# Patient Record
Sex: Male | Born: 2000 | Race: White | Marital: Single
Health system: Southern US, Community
[De-identification: ages and names within clinical notes are randomized; demographics above are authoritative.]

## PROBLEM LIST (undated history)

## (undated) ENCOUNTER — Emergency Department (HOSPITAL_COMMUNITY): Discharge status: Absent without leave | Payer: No Typology Code available for payment source

## (undated) DIAGNOSIS — R1110 Vomiting, unspecified: Secondary | ICD-10-CM

## (undated) DIAGNOSIS — G43019 Migraine without aura, intractable, without status migrainosus: Secondary | ICD-10-CM

## (undated) DIAGNOSIS — S0990XA Unspecified injury of head, initial encounter: Secondary | ICD-10-CM

## (undated) DIAGNOSIS — J302 Other seasonal allergic rhinitis: Secondary | ICD-10-CM

## (undated) DIAGNOSIS — R197 Diarrhea, unspecified: Secondary | ICD-10-CM

## (undated) DIAGNOSIS — Z789 Other specified health status: Secondary | ICD-10-CM

## (undated) DIAGNOSIS — Z8781 Personal history of (healed) traumatic fracture: Secondary | ICD-10-CM

## (undated) DIAGNOSIS — J45909 Unspecified asthma, uncomplicated: Secondary | ICD-10-CM

## (undated) DIAGNOSIS — F419 Anxiety disorder, unspecified: Secondary | ICD-10-CM

## (undated) HISTORY — DX: Diarrhea, unspecified: R19.7

## (undated) HISTORY — PX: NO PAST SURGERIES: SHX2092

## (undated) HISTORY — DX: Vomiting, unspecified: R11.10

## (undated) HISTORY — DX: Migraine without aura, intractable, without status migrainosus: G43.019

---
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
## 2018-01-05 ENCOUNTER — Encounter (HOSPITAL_COMMUNITY): Payer: Self-pay | Admitting: *Deleted

## 2018-01-05 ENCOUNTER — Other Ambulatory Visit: Payer: Self-pay

## 2018-01-05 ENCOUNTER — Emergency Department (HOSPITAL_COMMUNITY)
Admission: EM | Admit: 2018-01-05 | Discharge: 2018-01-05 | Disposition: A | Discharge status: Home / Self Care | Payer: Medicaid Other | Attending: Emergency Medicine | Admitting: Emergency Medicine

## 2018-01-05 ENCOUNTER — Emergency Department (HOSPITAL_COMMUNITY): Payer: Medicaid Other

## 2018-01-05 DIAGNOSIS — F17210 Nicotine dependence, cigarettes, uncomplicated: Secondary | ICD-10-CM | POA: Diagnosis not present

## 2018-01-05 DIAGNOSIS — R000 Tachycardia, unspecified: Secondary | ICD-10-CM | POA: Insufficient documentation

## 2018-01-05 DIAGNOSIS — R51 Headache: Secondary | ICD-10-CM | POA: Insufficient documentation

## 2018-01-05 DIAGNOSIS — J09X9 Influenza due to identified novel influenza A virus with other manifestations: Secondary | ICD-10-CM | POA: Diagnosis not present

## 2018-01-05 DIAGNOSIS — Z113 Encounter for screening for infections with a predominantly sexual mode of transmission: Secondary | ICD-10-CM | POA: Diagnosis not present

## 2018-01-05 DIAGNOSIS — M791 Myalgia, unspecified site: Secondary | ICD-10-CM | POA: Insufficient documentation

## 2018-01-05 DIAGNOSIS — R05 Cough: Secondary | ICD-10-CM | POA: Diagnosis present

## 2018-01-05 DIAGNOSIS — R509 Fever, unspecified: Secondary | ICD-10-CM | POA: Diagnosis not present

## 2018-01-05 DIAGNOSIS — R0981 Nasal congestion: Secondary | ICD-10-CM | POA: Diagnosis not present

## 2018-01-05 DIAGNOSIS — R0602 Shortness of breath: Secondary | ICD-10-CM | POA: Diagnosis not present

## 2018-01-05 DIAGNOSIS — R300 Dysuria: Secondary | ICD-10-CM | POA: Insufficient documentation

## 2018-01-05 DIAGNOSIS — J101 Influenza due to other identified influenza virus with other respiratory manifestations: Secondary | ICD-10-CM

## 2018-01-05 DIAGNOSIS — R0789 Other chest pain: Secondary | ICD-10-CM | POA: Diagnosis not present

## 2018-01-05 DIAGNOSIS — Z79899 Other long term (current) drug therapy: Secondary | ICD-10-CM | POA: Insufficient documentation

## 2018-01-05 LAB — CBC WITH DIFFERENTIAL/PLATELET
Basophils Absolute: 0 10*3/uL (ref 0.0–0.1)
Basophils Relative: 0 %
Eosinophils Absolute: 0 10*3/uL (ref 0.0–0.7)
Eosinophils Relative: 0 %
HCT: 38.9 % — ABNORMAL LOW (ref 39.0–52.0)
Hemoglobin: 13.2 g/dL (ref 13.0–17.0)
Lymphocytes Relative: 7 %
Lymphs Abs: 0.5 10*3/uL — ABNORMAL LOW (ref 0.7–4.0)
MCH: 29.8 pg (ref 26.0–34.0)
MCHC: 33.9 g/dL (ref 30.0–36.0)
MCV: 87.8 fL (ref 78.0–100.0)
Monocytes Absolute: 0.3 10*3/uL (ref 0.1–1.0)
Monocytes Relative: 4 %
Neutro Abs: 6.7 10*3/uL (ref 1.7–7.7)
Neutrophils Relative %: 89 %
Platelets: 152 10*3/uL (ref 150–400)
RBC: 4.43 MIL/uL (ref 4.22–5.81)
RDW: 12.5 % (ref 11.5–15.5)
WBC: 7.5 10*3/uL (ref 4.0–10.5)

## 2018-01-05 LAB — URINALYSIS, ROUTINE W REFLEX MICROSCOPIC
Bacteria, UA: NONE SEEN
Bilirubin Urine: NEGATIVE
Glucose, UA: NEGATIVE mg/dL
Ketones, ur: 80 mg/dL — AB
Leukocytes, UA: NEGATIVE
Nitrite: NEGATIVE
Protein, ur: NEGATIVE mg/dL
Specific Gravity, Urine: 1.012 (ref 1.005–1.030)
Squamous Epithelial / LPF: NONE SEEN
pH: 6 (ref 5.0–8.0)

## 2018-01-05 LAB — COMPREHENSIVE METABOLIC PANEL
ALT: 22 U/L (ref 17–63)
AST: 20 U/L (ref 15–41)
Albumin: 3.3 g/dL — ABNORMAL LOW (ref 3.5–5.0)
Alkaline Phosphatase: 64 U/L (ref 38–126)
Anion gap: 11 (ref 5–15)
BUN: 12 mg/dL (ref 6–20)
CO2: 20 mmol/L — ABNORMAL LOW (ref 22–32)
Calcium: 8 mg/dL — ABNORMAL LOW (ref 8.9–10.3)
Chloride: 104 mmol/L (ref 101–111)
Creatinine, Ser: 0.96 mg/dL (ref 0.61–1.24)
GFR calc Af Amer: >60 mL/min (ref >60)
GFR calc non Af Amer: >60 mL/min (ref >60)
Glucose, Bld: 113 mg/dL — ABNORMAL HIGH (ref 65–99)
Potassium: 3 mmol/L — ABNORMAL LOW (ref 3.5–5.1)
Sodium: 135 mmol/L (ref 135–145)
Total Bilirubin: 0.8 mg/dL (ref 0.3–1.2)
Total Protein: 5.5 g/dL — ABNORMAL LOW (ref 6.5–8.1)

## 2018-01-05 LAB — INFLUENZA PANEL BY PCR (TYPE A & B)
Influenza A By PCR: POSITIVE — AB
Influenza B By PCR: NEGATIVE

## 2018-01-05 MED ORDER — SODIUM CHLORIDE 0.9 % IV BOLUS (SEPSIS)
1000.0000 mL | Freq: Once | INTRAVENOUS | Status: AC
  Administered 2018-01-05: 1000 mL via INTRAVENOUS

## 2018-01-05 MED ORDER — ACETAMINOPHEN 325 MG PO TABS
650.0000 mg | ORAL_TABLET | Freq: Once | ORAL | Status: AC | PRN
  Administered 2018-01-05: 650 mg via ORAL
  Filled 2018-01-05: qty 2

## 2018-01-05 MED ORDER — ACETAMINOPHEN 500 MG PO TABS: 1000.0000 mg | ORAL_TABLET | Freq: Four times a day (QID) | ORAL | 0 refills | Status: DC | PRN

## 2018-01-05 MED ORDER — ACETAMINOPHEN 325 MG PO TABS
650.0000 mg | ORAL_TABLET | Freq: Once | ORAL | Status: AC
  Administered 2018-01-05: 650 mg via ORAL
  Filled 2018-01-05: qty 2

## 2018-01-05 MED ORDER — IBUPROFEN 600 MG PO TABS: 600.0000 mg | ORAL_TABLET | Freq: Four times a day (QID) | ORAL | 0 refills | Status: DC | PRN

## 2018-01-05 MED ORDER — BENZONATATE 100 MG PO CAPS
100.0000 mg | ORAL_CAPSULE | Freq: Once | ORAL | Status: AC
  Administered 2018-01-05: 100 mg via ORAL
  Filled 2018-01-05: qty 1

## 2018-01-05 MED ORDER — KETOROLAC TROMETHAMINE 30 MG/ML IJ SOLN
30.0000 mg | Freq: Once | INTRAMUSCULAR | Status: AC
  Administered 2018-01-05: 30 mg via INTRAVENOUS
  Filled 2018-01-05: qty 1

## 2018-01-05 MED ORDER — BENZONATATE 100 MG PO CAPS: 100.0000 mg | ORAL_CAPSULE | Freq: Three times a day (TID) | ORAL | 0 refills | Status: DC

## 2018-01-05 MED ORDER — POTASSIUM CHLORIDE CRYS ER 20 MEQ PO TBCR
60.0000 meq | EXTENDED_RELEASE_TABLET | Freq: Once | ORAL | Status: AC
  Administered 2018-01-05: 60 meq via ORAL
  Filled 2018-01-05: qty 3

## 2018-01-05 MED ORDER — OSELTAMIVIR PHOSPHATE 75 MG PO CAPS: 75.0000 mg | ORAL_CAPSULE | Freq: Two times a day (BID) | ORAL | 0 refills | Status: DC

## 2018-01-05 NOTE — ED Triage Notes (Signed)
Pt arrived by [facility] for flu like symptoms x 2 days. No relief with otc meds last night. HR 130 on arrival and febrile. Has cough, headache, bodyaches.
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
## 2018-01-05 NOTE — ED Notes (Signed)
Pt repeatedly stating that he is dizzy and about to pass out at triage.
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
## 2018-01-05 NOTE — ED Notes (Signed)
Pt returned from radiology on stretcher with tech, tolerated well.  

## 2018-01-05 NOTE — Discharge Instructions (Signed)
Medications: Tamiflu, Tessalon, ibuprofen, Tylenol  Treatment: Take Tamiflu as prescribed for 5 days.  Take Tessalon every 8 hours as needed for cough.  Take ibuprofen as prescribed every 6 hours or take 800 mg over-the-counter every 8 hours.  Take Tylenol as prescribed every 6 hours.  Do not take more than 4000 mg of Tylenol in a 24-hour period or 2400 mg of ibuprofen and 24-hour period.  Make sure to drink plenty of fluids and get plenty of rest.  Follow-up: Please return to emergency department if you develop any new or worsening symptoms.  Please follow-up with your doctor in 4-5 days if your symptoms are not improving.

## 2018-01-05 NOTE — ED Notes (Signed)
PT states understanding of care given, follow up care, and medication prescribed. PT ambulated from ED to car with a steady gait. 

## 2018-01-08 LAB — GC/CHLAMYDIA PROBE AMP (~~LOC~~) NOT AT ARMC
Chlamydia: NEGATIVE
Neisseria Gonorrhea: NEGATIVE

## 2018-05-27 ENCOUNTER — Emergency Department (HOSPITAL_COMMUNITY)
Admission: EM | Admit: 2018-05-27 | Discharge: 2018-05-27 | Disposition: A | Discharge status: Home / Self Care | Payer: Medicaid Other | Attending: Emergency Medicine | Admitting: Emergency Medicine

## 2018-05-27 ENCOUNTER — Other Ambulatory Visit: Payer: Self-pay

## 2018-05-27 ENCOUNTER — Emergency Department (HOSPITAL_COMMUNITY): Payer: Medicaid Other

## 2018-05-27 ENCOUNTER — Encounter (HOSPITAL_COMMUNITY): Payer: Self-pay | Admitting: Emergency Medicine

## 2018-05-27 DIAGNOSIS — F17210 Nicotine dependence, cigarettes, uncomplicated: Secondary | ICD-10-CM | POA: Insufficient documentation

## 2018-05-27 DIAGNOSIS — J069 Acute upper respiratory infection, unspecified: Secondary | ICD-10-CM | POA: Insufficient documentation

## 2018-05-27 DIAGNOSIS — R062 Wheezing: Secondary | ICD-10-CM | POA: Diagnosis not present

## 2018-05-27 DIAGNOSIS — R112 Nausea with vomiting, unspecified: Secondary | ICD-10-CM | POA: Diagnosis present

## 2018-05-27 DIAGNOSIS — Z79899 Other long term (current) drug therapy: Secondary | ICD-10-CM | POA: Insufficient documentation

## 2018-05-27 DIAGNOSIS — R55 Syncope and collapse: Secondary | ICD-10-CM | POA: Insufficient documentation

## 2018-05-27 LAB — CBC
HCT: 48 % (ref 39.0–52.0)
Hemoglobin: 16.3 g/dL (ref 13.0–17.0)
MCH: 30 pg (ref 26.0–34.0)
MCHC: 34 g/dL (ref 30.0–36.0)
MCV: 88.2 fL (ref 78.0–100.0)
Platelets: 210 10*3/uL (ref 150–400)
RBC: 5.44 MIL/uL (ref 4.22–5.81)
RDW: 12.3 % (ref 11.5–15.5)
WBC: 12.5 10*3/uL — ABNORMAL HIGH (ref 4.0–10.5)

## 2018-05-27 LAB — BASIC METABOLIC PANEL
Anion gap: 12 (ref 5–15)
BUN: 8 mg/dL (ref 6–20)
CO2: 20 mmol/L — ABNORMAL LOW (ref 22–32)
Calcium: 9.3 mg/dL (ref 8.9–10.3)
Chloride: 106 mmol/L (ref 98–111)
Creatinine, Ser: 0.79 mg/dL (ref 0.61–1.24)
GFR calc Af Amer: >60 mL/min (ref >60)
GFR calc non Af Amer: >60 mL/min (ref >60)
Glucose, Bld: 121 mg/dL — ABNORMAL HIGH (ref 70–99)
Potassium: 3.6 mmol/L (ref 3.5–5.1)
Sodium: 138 mmol/L (ref 135–145)

## 2018-05-27 LAB — I-STAT TROPONIN, ED: Troponin i, poc: 0 ng/mL (ref 0.00–0.08)

## 2018-05-27 LAB — I-STAT CG4 LACTIC ACID, ED: Lactic Acid, Venous: 1.74 mmol/L (ref 0.5–1.9)

## 2018-05-27 MED ORDER — BENZONATATE 100 MG PO CAPS
100.0000 mg | ORAL_CAPSULE | Freq: Three times a day (TID) | ORAL | 0 refills | Status: AC
Start: 2018-05-27 — End: 2018-06-01

## 2018-05-27 MED ORDER — SODIUM CHLORIDE 0.9 % IV BOLUS
1000.0000 mL | Freq: Once | INTRAVENOUS | Status: AC
  Administered 2018-05-27: 1000 mL via INTRAVENOUS

## 2018-05-27 MED ORDER — IBUPROFEN 400 MG PO TABS
400.0000 mg | ORAL_TABLET | Freq: Once | ORAL | Status: AC
  Administered 2018-05-27: 400 mg via ORAL
  Filled 2018-05-27: qty 1

## 2018-05-27 MED ORDER — ALBUTEROL SULFATE HFA 108 (90 BASE) MCG/ACT IN AERS
2.0000 | INHALATION_SPRAY | Freq: Four times a day (QID) | RESPIRATORY_TRACT | Status: DC
  Administered 2018-05-27: 2 via RESPIRATORY_TRACT
  Filled 2018-05-27: qty 6.7

## 2018-05-27 MED ORDER — ONDANSETRON 4 MG PO TBDP: 4.0000 mg | ORAL_TABLET | Freq: Three times a day (TID) | ORAL | 0 refills | Status: AC | PRN

## 2018-05-27 MED ORDER — ONDANSETRON 4 MG PO TBDP
4.0000 mg | ORAL_TABLET | Freq: Once | ORAL | Status: AC | PRN
  Administered 2018-05-27: 4 mg via ORAL
  Filled 2018-05-27: qty 1

## 2018-05-27 MED ORDER — IPRATROPIUM-ALBUTEROL 0.5-2.5 (3) MG/3ML IN SOLN
3.0000 mL | Freq: Once | RESPIRATORY_TRACT | Status: AC
  Administered 2018-05-27: 3 mL via RESPIRATORY_TRACT
  Filled 2018-05-27: qty 3

## 2018-05-27 MED ORDER — IBUPROFEN 400 MG PO TABS
400.0000 mg | ORAL_TABLET | Freq: Three times a day (TID) | ORAL | 0 refills | Status: AC | PRN
Start: 2018-05-27 — End: 2018-06-01

## 2018-05-27 NOTE — ED Notes (Signed)
Pt pale and vomiting in triage

## 2018-05-27 NOTE — Discharge Instructions (Signed)
You may use Zofran as prescribed to needed to treat nausea. You may use Tessalon Perls to help with your cough. You may use over-the-counter anti-inflammatories such as Tylenol or ibuprofen as needed for pain. You may use your albuterol inhaler as prescribed if needed for wheezing, if your shortness of breath it is worse please return to the emergency department. Please be sure to drink plenty of water to avoid dehydration and eat food. Please follow-up with your primary care provider regarding your visit today. Please try to quit smoking. Return to the emergency department for any new or worsening symptoms.  Contact a health care provider if: You are getting worse rather than better. Your symptoms are not controlled by medicine. You have chills. You have worsening shortness of breath. You have brown or red mucus. You have yellow or brown nasal discharge. You have pain in your face, especially when you bend forward. You have a fever. You have swollen neck glands. You have pain while swallowing. You have white areas in the back of your throat. Get help right away if: You have severe or persistent: Headache. Ear pain. Sinus pain. Chest pain. You have chronic lung disease and any of the following: Wheezing. Prolonged cough. Coughing up blood. A change in your usual mucus. You have a stiff neck. You have changes in your: Vision. Hearing. Thinking. Mood.

## 2018-05-27 NOTE — ED Triage Notes (Signed)
Pt presents feeling fatigued, weak, and having 2 episodes of syncope PTA; pt states he has been coughing and subjective fever x 3 -4days with audible expiratory wheezing; pt reports constant dizziness also

## 2018-05-27 NOTE — ED Notes (Signed)
Patient verbalizes understanding of discharge instructions. Opportunity for questioning and answers were provided. 
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
## 2018-05-28 ENCOUNTER — Other Ambulatory Visit: Payer: Self-pay

## 2018-05-28 ENCOUNTER — Emergency Department (HOSPITAL_COMMUNITY)
Admission: EM | Admit: 2018-05-28 | Discharge: 2018-05-29 | Disposition: A | Discharge status: Home / Self Care | Payer: Medicaid Other | Attending: Emergency Medicine | Admitting: Emergency Medicine

## 2018-05-28 ENCOUNTER — Encounter (HOSPITAL_COMMUNITY): Payer: Self-pay | Admitting: Emergency Medicine

## 2018-05-28 DIAGNOSIS — Z5321 Procedure and treatment not carried out due to patient leaving prior to being seen by health care provider: Secondary | ICD-10-CM | POA: Insufficient documentation

## 2018-05-28 DIAGNOSIS — R509 Fever, unspecified: Secondary | ICD-10-CM | POA: Diagnosis present

## 2018-05-28 LAB — URINALYSIS, ROUTINE W REFLEX MICROSCOPIC
BILIRUBIN URINE: NEGATIVE
Bacteria, UA: NONE SEEN
Glucose, UA: NEGATIVE mg/dL
Ketones, ur: 5 mg/dL — AB
Leukocytes, UA: NEGATIVE
Nitrite: NEGATIVE
PH: 6 (ref 5.0–8.0)
Protein, ur: 30 mg/dL — AB
SPECIFIC GRAVITY, URINE: 1.024 (ref 1.005–1.030)

## 2018-05-28 LAB — COMPREHENSIVE METABOLIC PANEL
ALT: 17 U/L (ref 0–44)
AST: 17 U/L (ref 15–41)
Albumin: 3.5 g/dL (ref 3.5–5.0)
Alkaline Phosphatase: 80 U/L (ref 38–126)
Anion gap: 10 (ref 5–15)
BUN: 5 mg/dL — AB (ref 6–20)
CHLORIDE: 106 mmol/L (ref 98–111)
CO2: 22 mmol/L (ref 22–32)
CREATININE: 0.84 mg/dL (ref 0.61–1.24)
Calcium: 8.7 mg/dL — ABNORMAL LOW (ref 8.9–10.3)
GFR calc non Af Amer: >60 mL/min (ref >60)
Glucose, Bld: 112 mg/dL — ABNORMAL HIGH (ref 70–99)
Potassium: 3.4 mmol/L — ABNORMAL LOW (ref 3.5–5.1)
SODIUM: 138 mmol/L (ref 135–145)
Total Bilirubin: 0.5 mg/dL (ref 0.3–1.2)
Total Protein: 6.2 g/dL — ABNORMAL LOW (ref 6.5–8.1)

## 2018-05-28 LAB — CBC
HCT: 47.1 % (ref 39.0–52.0)
Hemoglobin: 15.7 g/dL (ref 13.0–17.0)
MCH: 30.1 pg (ref 26.0–34.0)
MCHC: 33.3 g/dL (ref 30.0–36.0)
MCV: 90.2 fL (ref 78.0–100.0)
PLATELETS: 182 10*3/uL (ref 150–400)
RBC: 5.22 MIL/uL (ref 4.22–5.81)
RDW: 12.3 % (ref 11.5–15.5)
WBC: 7.6 10*3/uL (ref 4.0–10.5)

## 2018-05-28 LAB — LIPASE, BLOOD: Lipase: 26 U/L (ref 11–51)

## 2018-05-28 MED ORDER — ACETAMINOPHEN 325 MG PO TABS
650.0000 mg | ORAL_TABLET | Freq: Once | ORAL | Status: AC | PRN
  Administered 2018-05-28: 650 mg via ORAL
  Filled 2018-05-28: qty 2

## 2018-05-28 NOTE — ED Triage Notes (Signed)
Pt returned today, has fever despite Ibprofen at 6pm, tachycardia, diarrhea, cough and weakness.  Refused zofran states it makes the room spin.

## 2018-05-29 LAB — I-STAT CG4 LACTIC ACID, ED: Lactic Acid, Venous: 0.95 mmol/L (ref 0.5–1.9)

## 2018-05-29 MED ORDER — IBUPROFEN 100 MG/5ML PO SUSP
800.0000 mg | Freq: Once | ORAL | Status: AC
  Administered 2018-05-29: 800 mg via ORAL
  Filled 2018-05-29: qty 40

## 2018-05-29 NOTE — ED Notes (Signed)
Patient left ED after being medicated.

## 2018-05-30 ENCOUNTER — Emergency Department (HOSPITAL_COMMUNITY)
Admission: EM | Admit: 2018-05-30 | Discharge: 2018-05-31 | Disposition: A | Discharge status: Home / Self Care | Payer: Medicaid Other | Attending: Emergency Medicine | Admitting: Emergency Medicine

## 2018-05-30 ENCOUNTER — Encounter (HOSPITAL_COMMUNITY): Payer: Self-pay | Admitting: Emergency Medicine

## 2018-05-30 ENCOUNTER — Emergency Department (HOSPITAL_COMMUNITY): Payer: Medicaid Other

## 2018-05-30 ENCOUNTER — Other Ambulatory Visit: Payer: Self-pay

## 2018-05-30 DIAGNOSIS — J45909 Unspecified asthma, uncomplicated: Secondary | ICD-10-CM | POA: Insufficient documentation

## 2018-05-30 DIAGNOSIS — Z79899 Other long term (current) drug therapy: Secondary | ICD-10-CM | POA: Diagnosis not present

## 2018-05-30 DIAGNOSIS — B2790 Infectious mononucleosis, unspecified without complication: Secondary | ICD-10-CM | POA: Diagnosis not present

## 2018-05-30 DIAGNOSIS — R112 Nausea with vomiting, unspecified: Secondary | ICD-10-CM | POA: Diagnosis not present

## 2018-05-30 DIAGNOSIS — E860 Dehydration: Secondary | ICD-10-CM

## 2018-05-30 DIAGNOSIS — R197 Diarrhea, unspecified: Secondary | ICD-10-CM | POA: Diagnosis not present

## 2018-05-30 DIAGNOSIS — R05 Cough: Secondary | ICD-10-CM | POA: Diagnosis present

## 2018-05-30 DIAGNOSIS — F17210 Nicotine dependence, cigarettes, uncomplicated: Secondary | ICD-10-CM | POA: Diagnosis not present

## 2018-05-30 HISTORY — DX: Unspecified asthma, uncomplicated: J45.909

## 2018-05-30 LAB — COMPREHENSIVE METABOLIC PANEL
ALT: 17 U/L (ref 0–44)
AST: 23 U/L (ref 15–41)
Albumin: 3.8 g/dL (ref 3.5–5.0)
Alkaline Phosphatase: 72 U/L (ref 38–126)
Anion gap: 11 (ref 5–15)
BUN: 7 mg/dL (ref 6–20)
CALCIUM: 8.7 mg/dL — AB (ref 8.9–10.3)
CHLORIDE: 102 mmol/L (ref 98–111)
CO2: 22 mmol/L (ref 22–32)
CREATININE: 0.73 mg/dL (ref 0.61–1.24)
Glucose, Bld: 113 mg/dL — ABNORMAL HIGH (ref 70–99)
Potassium: 3.5 mmol/L (ref 3.5–5.1)
SODIUM: 135 mmol/L (ref 135–145)
TOTAL PROTEIN: 7.2 g/dL (ref 6.5–8.1)
Total Bilirubin: 0.7 mg/dL (ref 0.3–1.2)

## 2018-05-30 LAB — CBC
HCT: 47.6 % (ref 39.0–52.0)
Hemoglobin: 16.4 g/dL (ref 13.0–17.0)
MCH: 30.3 pg (ref 26.0–34.0)
MCHC: 34.5 g/dL (ref 30.0–36.0)
MCV: 88 fL (ref 78.0–100.0)
PLATELETS: 170 10*3/uL (ref 150–400)
RBC: 5.41 MIL/uL (ref 4.22–5.81)
RDW: 12.5 % (ref 11.5–15.5)
WBC: 6.6 10*3/uL (ref 4.0–10.5)

## 2018-05-30 LAB — URINALYSIS, ROUTINE W REFLEX MICROSCOPIC
Bacteria, UA: NONE SEEN
Bilirubin Urine: NEGATIVE
GLUCOSE, UA: NEGATIVE mg/dL
KETONES UR: 80 mg/dL — AB
LEUKOCYTES UA: NEGATIVE
Nitrite: NEGATIVE
PH: 6 (ref 5.0–8.0)
PROTEIN: 30 mg/dL — AB
Specific Gravity, Urine: 1.019 (ref 1.005–1.030)

## 2018-05-30 LAB — I-STAT CG4 LACTIC ACID, ED
Lactic Acid, Venous: 1.28 mmol/L (ref 0.5–1.9)
Lactic Acid, Venous: 0.96 mmol/L (ref 0.5–1.9)

## 2018-05-30 LAB — LIPASE, BLOOD: LIPASE: 23 U/L (ref 11–51)

## 2018-05-30 LAB — MONONUCLEOSIS SCREEN: Mono Screen: POSITIVE — AB

## 2018-05-30 LAB — GROUP A STREP BY PCR: GROUP A STREP BY PCR: NOT DETECTED

## 2018-05-30 MED ORDER — PROCHLORPERAZINE EDISYLATE 10 MG/2ML IJ SOLN
10.0000 mg | Freq: Once | INTRAMUSCULAR | Status: AC
  Administered 2018-05-30: 10 mg via INTRAVENOUS
  Filled 2018-05-30: qty 2

## 2018-05-30 MED ORDER — DEXAMETHASONE 4 MG PO TABS
10.0000 mg | ORAL_TABLET | Freq: Once | ORAL | Status: AC
  Administered 2018-05-31: 10 mg via ORAL
  Filled 2018-05-30: qty 2

## 2018-05-30 MED ORDER — LOPERAMIDE HCL 2 MG PO CAPS: 2.0000 mg | ORAL_CAPSULE | Freq: Four times a day (QID) | ORAL | 0 refills | Status: DC | PRN

## 2018-05-30 MED ORDER — GI COCKTAIL ~~LOC~~
30.0000 mL | Freq: Once | ORAL | Status: AC
  Administered 2018-05-30: 30 mL via ORAL
  Filled 2018-05-30: qty 30

## 2018-05-30 MED ORDER — SODIUM CHLORIDE 0.9 % IV BOLUS
1000.0000 mL | Freq: Once | INTRAVENOUS | Status: AC
  Administered 2018-05-30: 1000 mL via INTRAVENOUS

## 2018-05-30 MED ORDER — ACETAMINOPHEN 325 MG PO TABS
650.0000 mg | ORAL_TABLET | Freq: Once | ORAL | Status: DC | PRN
  Filled 2018-05-30: qty 2

## 2018-05-30 MED ORDER — METOCLOPRAMIDE HCL 10 MG PO TABS: 10.0000 mg | ORAL_TABLET | Freq: Four times a day (QID) | ORAL | 0 refills | Status: DC

## 2018-05-30 MED ORDER — KETOROLAC TROMETHAMINE 15 MG/ML IJ SOLN
15.0000 mg | Freq: Once | INTRAMUSCULAR | Status: AC
  Administered 2018-05-30: 15 mg via INTRAVENOUS
  Filled 2018-05-30: qty 1

## 2018-05-30 MED ORDER — LOPERAMIDE HCL 2 MG PO CAPS
2.0000 mg | ORAL_CAPSULE | Freq: Once | ORAL | Status: AC
  Administered 2018-05-30: 2 mg via ORAL
  Filled 2018-05-30: qty 1
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
## 2018-05-30 NOTE — ED Triage Notes (Signed)
Per EMS- pt called EMS to residence with c/o NVD x 4 days. Pt is unable to keep any fluids or medication down on his stomach.. Pt is alert, ambulatory and appropriate

## 2018-05-30 NOTE — Discharge Instructions (Addendum)
Rest and drink plenty of fluids Take Reglan for nausea Take Imodium for diarrhea Continue Tylenol and Ibuprofen for fever  Please return if you are worsening

## 2018-07-16 ENCOUNTER — Encounter (HOSPITAL_COMMUNITY): Payer: Self-pay | Admitting: Emergency Medicine

## 2018-07-16 ENCOUNTER — Emergency Department (HOSPITAL_COMMUNITY): Payer: Medicaid Other

## 2018-07-16 ENCOUNTER — Other Ambulatory Visit: Payer: Self-pay

## 2018-07-16 ENCOUNTER — Emergency Department (HOSPITAL_COMMUNITY)
Admission: EM | Admit: 2018-07-16 | Discharge: 2018-07-16 | Disposition: A | Discharge status: Home / Self Care | Payer: Medicaid Other | Attending: Emergency Medicine | Admitting: Emergency Medicine

## 2018-07-16 DIAGNOSIS — S01511A Laceration without foreign body of lip, initial encounter: Secondary | ICD-10-CM | POA: Insufficient documentation

## 2018-07-16 DIAGNOSIS — R51 Headache: Secondary | ICD-10-CM | POA: Insufficient documentation

## 2018-07-16 DIAGNOSIS — Y929 Unspecified place or not applicable: Secondary | ICD-10-CM | POA: Insufficient documentation

## 2018-07-16 DIAGNOSIS — Z23 Encounter for immunization: Secondary | ICD-10-CM | POA: Diagnosis not present

## 2018-07-16 DIAGNOSIS — J45909 Unspecified asthma, uncomplicated: Secondary | ICD-10-CM | POA: Insufficient documentation

## 2018-07-16 DIAGNOSIS — Y9389 Activity, other specified: Secondary | ICD-10-CM | POA: Insufficient documentation

## 2018-07-16 DIAGNOSIS — Y999 Unspecified external cause status: Secondary | ICD-10-CM | POA: Diagnosis not present

## 2018-07-16 DIAGNOSIS — F17210 Nicotine dependence, cigarettes, uncomplicated: Secondary | ICD-10-CM | POA: Diagnosis not present

## 2018-07-16 DIAGNOSIS — T148XXA Other injury of unspecified body region, initial encounter: Secondary | ICD-10-CM

## 2018-07-16 LAB — CBC WITH DIFFERENTIAL/PLATELET
Abs Immature Granulocytes: 0.1 10*3/uL (ref 0.0–0.1)
Basophils Absolute: 0.1 10*3/uL (ref 0.0–0.1)
Basophils Relative: 1 %
EOS ABS: 0.2 10*3/uL (ref 0.0–0.7)
EOS PCT: 2 %
HEMATOCRIT: 47.1 % (ref 39.0–52.0)
HEMOGLOBIN: 15.5 g/dL (ref 13.0–17.0)
Immature Granulocytes: 1 %
LYMPHS ABS: 1.7 10*3/uL (ref 0.7–4.0)
LYMPHS PCT: 16 %
MCH: 30.2 pg (ref 26.0–34.0)
MCHC: 32.9 g/dL (ref 30.0–36.0)
MCV: 91.8 fL (ref 78.0–100.0)
MONOS PCT: 5 %
Monocytes Absolute: 0.6 10*3/uL (ref 0.1–1.0)
Neutro Abs: 7.9 10*3/uL — ABNORMAL HIGH (ref 1.7–7.7)
Neutrophils Relative %: 75 %
Platelets: 253 10*3/uL (ref 150–400)
RBC: 5.13 MIL/uL (ref 4.22–5.81)
RDW: 13.2 % (ref 11.5–15.5)
WBC: 10.5 10*3/uL (ref 4.0–10.5)

## 2018-07-16 LAB — COMPREHENSIVE METABOLIC PANEL
ALBUMIN: 3.9 g/dL (ref 3.5–5.0)
ALK PHOS: 77 U/L (ref 38–126)
ALT: 23 U/L (ref 0–44)
ANION GAP: 14 (ref 5–15)
AST: 19 U/L (ref 15–41)
BILIRUBIN TOTAL: 0.4 mg/dL (ref 0.3–1.2)
BUN: 11 mg/dL (ref 6–20)
CALCIUM: 9.2 mg/dL (ref 8.9–10.3)
CO2: 20 mmol/L — ABNORMAL LOW (ref 22–32)
Chloride: 108 mmol/L (ref 98–111)
Creatinine, Ser: 0.85 mg/dL (ref 0.61–1.24)
GFR calc Af Amer: >60 mL/min (ref >60)
Glucose, Bld: 109 mg/dL — ABNORMAL HIGH (ref 70–99)
POTASSIUM: 3.5 mmol/L (ref 3.5–5.1)
Sodium: 142 mmol/L (ref 135–145)
TOTAL PROTEIN: 6.9 g/dL (ref 6.5–8.1)

## 2018-07-16 MED ORDER — MORPHINE SULFATE (PF) 2 MG/ML IV SOLN
2.0000 mg | Freq: Once | INTRAVENOUS | Status: AC
  Administered 2018-07-16: 2 mg via INTRAVENOUS
  Filled 2018-07-16: qty 1

## 2018-07-16 MED ORDER — SODIUM CHLORIDE 0.9 % IV BOLUS
1000.0000 mL | Freq: Once | INTRAVENOUS | Status: AC
Start: 2018-07-16 — End: 2018-07-16
  Administered 2018-07-16: 1000 mL via INTRAVENOUS

## 2018-07-16 MED ORDER — TETANUS-DIPHTH-ACELL PERTUSSIS 5-2.5-18.5 LF-MCG/0.5 IM SUSP: 0.5000 mL | Freq: Once | INTRAMUSCULAR | Status: DC

## 2018-07-16 MED ORDER — KETOROLAC TROMETHAMINE 30 MG/ML IJ SOLN
30.0000 mg | Freq: Once | INTRAMUSCULAR | Status: AC
  Administered 2018-07-16: 30 mg via INTRAVENOUS
  Filled 2018-07-16: qty 1

## 2018-07-16 MED ORDER — LIDOCAINE HCL (PF) 1 % IJ SOLN
5.0000 mL | Freq: Once | INTRAMUSCULAR | Status: AC
  Administered 2018-07-16: 5 mL via INTRADERMAL
  Filled 2018-07-16: qty 5

## 2018-07-16 NOTE — ED Notes (Signed)
C-collar applied
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
## 2018-07-16 NOTE — ED Notes (Signed)
Delay in discharge paperwork, pt inpatient and not wanting to wait. MD has already discussed follow-up care and precautions. Pt A&Ox4 and ambulatory at discharge.
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
## 2018-07-16 NOTE — ED Notes (Signed)
Pt ambulated well in hallway. 

## 2018-09-08 ENCOUNTER — Emergency Department (HOSPITAL_COMMUNITY): Payer: Medicaid Other

## 2018-09-08 ENCOUNTER — Emergency Department (HOSPITAL_COMMUNITY)
Admission: EM | Admit: 2018-09-08 | Discharge: 2018-09-08 | Disposition: A | Discharge status: Home / Self Care | Payer: Medicaid Other | Attending: Emergency Medicine | Admitting: Emergency Medicine

## 2018-09-08 ENCOUNTER — Other Ambulatory Visit: Payer: Self-pay

## 2018-09-08 ENCOUNTER — Encounter (HOSPITAL_COMMUNITY): Payer: Self-pay | Admitting: Emergency Medicine

## 2018-09-08 DIAGNOSIS — J45909 Unspecified asthma, uncomplicated: Secondary | ICD-10-CM | POA: Insufficient documentation

## 2018-09-08 DIAGNOSIS — Y998 Other external cause status: Secondary | ICD-10-CM | POA: Insufficient documentation

## 2018-09-08 DIAGNOSIS — M25561 Pain in right knee: Secondary | ICD-10-CM | POA: Diagnosis not present

## 2018-09-08 DIAGNOSIS — F17210 Nicotine dependence, cigarettes, uncomplicated: Secondary | ICD-10-CM | POA: Diagnosis not present

## 2018-09-08 DIAGNOSIS — Z79899 Other long term (current) drug therapy: Secondary | ICD-10-CM | POA: Diagnosis not present

## 2018-09-08 DIAGNOSIS — T07XXXA Unspecified multiple injuries, initial encounter: Secondary | ICD-10-CM

## 2018-09-08 DIAGNOSIS — S80211A Abrasion, right knee, initial encounter: Secondary | ICD-10-CM | POA: Insufficient documentation

## 2018-09-08 DIAGNOSIS — Y939 Activity, unspecified: Secondary | ICD-10-CM | POA: Insufficient documentation

## 2018-09-08 DIAGNOSIS — S80212A Abrasion, left knee, initial encounter: Secondary | ICD-10-CM | POA: Insufficient documentation

## 2018-09-08 DIAGNOSIS — Y929 Unspecified place or not applicable: Secondary | ICD-10-CM | POA: Diagnosis not present

## 2018-09-08 MED ORDER — IBUPROFEN 400 MG PO TABS
600.0000 mg | ORAL_TABLET | Freq: Once | ORAL | Status: AC
  Administered 2018-09-08: 600 mg via ORAL
  Filled 2018-09-08: qty 1

## 2018-09-08 MED ORDER — HYDROCODONE-ACETAMINOPHEN 5-325 MG PO TABS
1.0000 | ORAL_TABLET | Freq: Once | ORAL | Status: AC
  Administered 2018-09-08: 1 via ORAL
  Filled 2018-09-08: qty 1

## 2018-09-08 MED ORDER — BACITRACIN ZINC 500 UNIT/GM EX OINT
TOPICAL_OINTMENT | Freq: Once | CUTANEOUS | Status: DC
  Filled 2018-09-08: qty 0.9

## 2018-09-08 NOTE — ED Notes (Signed)
Patient verbalizes understanding of discharge instructions. Opportunity for questioning and answers were provided. Armband removed by staff, pt discharged from ED.  
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
## 2018-09-08 NOTE — Discharge Instructions (Signed)
Your x-rays do not show any evidence of acute fracture or dislocation.  I think that your knee and leg pain is likely due to soft tissue injury.  You may have some injury to the ligaments or cartilage in your knee.  Please use knee brace and crutches for the next week, ibuprofen and Tylenol for pain, ice and elevate the knee as much as possible and if pain is persisting please follow-up with orthopedics.  Abrasions on your knee can be treated with antibiotic ointment and bandages monitor for any signs of infection such as redness, warmth, swelling, drainage or increasing pain in these areas.

## 2018-11-12 ENCOUNTER — Emergency Department
Admission: EM | Admit: 2018-11-12 | Discharge: 2018-11-12 | Disposition: A | Discharge status: Home / Self Care | Payer: Medicaid Other | Attending: Emergency Medicine | Admitting: Emergency Medicine

## 2018-11-12 ENCOUNTER — Other Ambulatory Visit: Payer: Self-pay

## 2018-11-12 ENCOUNTER — Encounter: Payer: Self-pay | Admitting: Emergency Medicine

## 2018-11-12 DIAGNOSIS — R42 Dizziness and giddiness: Secondary | ICD-10-CM | POA: Diagnosis not present

## 2018-11-12 DIAGNOSIS — Z5321 Procedure and treatment not carried out due to patient leaving prior to being seen by health care provider: Secondary | ICD-10-CM | POA: Insufficient documentation

## 2018-11-12 NOTE — ED Triage Notes (Signed)
Pt to triage via w/c with no distress noted; pt reports dizziness x 1-302months, "head tingling" and found lump behind testicle last night

## 2018-11-12 NOTE — ED Notes (Signed)
Pt instructed on protocols and plan of care; pt reports that he has an appointment in the morning for this c/o and "thought it would be a quick in and out"; instructed on importance of being evaluated but pt st that he is unable to wait and will keep his appointment for tomorrow; instructed to return for any new or worsening symptoms

## 2018-12-11 ENCOUNTER — Institutional Professional Consult (permissible substitution): Payer: Medicaid Other | Admitting: Neurology

## 2018-12-11 ENCOUNTER — Telehealth: Payer: Self-pay | Admitting: Neurology

## 2018-12-11 NOTE — Telephone Encounter (Signed)
This patient did not show for a consult visit today.

## 2018-12-12 ENCOUNTER — Encounter: Payer: Self-pay | Admitting: Neurology
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
## 2019-03-21 ENCOUNTER — Emergency Department (HOSPITAL_COMMUNITY)
Admission: EM | Admit: 2019-03-21 | Discharge: 2019-03-21 | Disposition: A | Discharge status: Home / Self Care | Payer: Medicaid Other | Attending: Emergency Medicine | Admitting: Emergency Medicine

## 2019-03-21 ENCOUNTER — Other Ambulatory Visit: Payer: Self-pay

## 2019-03-21 ENCOUNTER — Emergency Department (HOSPITAL_COMMUNITY): Payer: Medicaid Other

## 2019-03-21 ENCOUNTER — Encounter (HOSPITAL_COMMUNITY): Payer: Self-pay | Admitting: Emergency Medicine

## 2019-03-21 DIAGNOSIS — Y999 Unspecified external cause status: Secondary | ICD-10-CM | POA: Insufficient documentation

## 2019-03-21 DIAGNOSIS — S8001XA Contusion of right knee, initial encounter: Secondary | ICD-10-CM | POA: Insufficient documentation

## 2019-03-21 DIAGNOSIS — Z041 Encounter for examination and observation following transport accident: Secondary | ICD-10-CM | POA: Diagnosis present

## 2019-03-21 DIAGNOSIS — M25552 Pain in left hip: Secondary | ICD-10-CM | POA: Diagnosis not present

## 2019-03-21 DIAGNOSIS — M545 Low back pain: Secondary | ICD-10-CM | POA: Diagnosis not present

## 2019-03-21 DIAGNOSIS — Y929 Unspecified place or not applicable: Secondary | ICD-10-CM | POA: Insufficient documentation

## 2019-03-21 DIAGNOSIS — Y939 Activity, unspecified: Secondary | ICD-10-CM | POA: Diagnosis not present

## 2019-03-21 DIAGNOSIS — R109 Unspecified abdominal pain: Secondary | ICD-10-CM | POA: Diagnosis not present

## 2019-03-21 DIAGNOSIS — S20222A Contusion of left back wall of thorax, initial encounter: Secondary | ICD-10-CM | POA: Insufficient documentation

## 2019-03-21 HISTORY — DX: Personal history of (healed) traumatic fracture: Z87.81

## 2019-03-21 LAB — CBC
HCT: 48 % (ref 39.0–52.0)
Hemoglobin: 16.4 g/dL (ref 13.0–17.0)
MCH: 29.5 pg (ref 26.0–34.0)
MCHC: 34.2 g/dL (ref 30.0–36.0)
MCV: 86.3 fL (ref 80.0–100.0)
Platelets: 234 10*3/uL (ref 150–400)
RBC: 5.56 MIL/uL (ref 4.22–5.81)
RDW: 11.7 % (ref 11.5–15.5)
WBC: 8.5 10*3/uL (ref 4.0–10.5)
nRBC: 0 % (ref 0.0–0.2)

## 2019-03-21 LAB — URINALYSIS, ROUTINE W REFLEX MICROSCOPIC
Bilirubin Urine: NEGATIVE
Glucose, UA: NEGATIVE mg/dL
Hgb urine dipstick: NEGATIVE
Ketones, ur: 5 mg/dL — AB
Leukocytes,Ua: NEGATIVE
Nitrite: NEGATIVE
Protein, ur: NEGATIVE mg/dL
Specific Gravity, Urine: 1.041 — ABNORMAL HIGH (ref 1.005–1.030)
pH: 6 (ref 5.0–8.0)

## 2019-03-21 LAB — COMPREHENSIVE METABOLIC PANEL
ALT: 41 U/L (ref 0–44)
AST: 27 U/L (ref 15–41)
Albumin: 4.3 g/dL (ref 3.5–5.0)
Alkaline Phosphatase: 77 U/L (ref 38–126)
Anion gap: 11 (ref 5–15)
BUN: 17 mg/dL (ref 6–20)
CO2: 20 mmol/L — ABNORMAL LOW (ref 22–32)
Calcium: 9.2 mg/dL (ref 8.9–10.3)
Chloride: 109 mmol/L (ref 98–111)
Creatinine, Ser: 0.85 mg/dL (ref 0.61–1.24)
GFR calc Af Amer: >60 mL/min (ref >60)
GFR calc non Af Amer: >60 mL/min (ref >60)
Glucose, Bld: 103 mg/dL — ABNORMAL HIGH (ref 70–99)
Potassium: 3.5 mmol/L (ref 3.5–5.1)
Sodium: 140 mmol/L (ref 135–145)
Total Bilirubin: 0.7 mg/dL (ref 0.3–1.2)
Total Protein: 6.8 g/dL (ref 6.5–8.1)

## 2019-03-21 LAB — PROTIME-INR
INR: 1.1 (ref 0.8–1.2)
Prothrombin Time: 13.7 seconds (ref 11.4–15.2)

## 2019-03-21 LAB — ETHANOL: Alcohol, Ethyl (B): 24 mg/dL — ABNORMAL HIGH (ref <10)

## 2019-03-21 MED ORDER — SODIUM CHLORIDE 0.9 % IV BOLUS
1000.0000 mL | Freq: Once | INTRAVENOUS | Status: AC
  Administered 2019-03-21: 1000 mL via INTRAVENOUS

## 2019-03-21 MED ORDER — TRAMADOL HCL 50 MG PO TABS
50.0000 mg | ORAL_TABLET | Freq: Once | ORAL | Status: AC
  Administered 2019-03-21: 50 mg via ORAL
  Filled 2019-03-21: qty 1

## 2019-03-21 MED ORDER — HYDROMORPHONE HCL 1 MG/ML IJ SOLN
1.0000 mg | Freq: Once | INTRAMUSCULAR | Status: DC
  Filled 2019-03-21: qty 1

## 2019-03-21 MED ORDER — ONDANSETRON HCL 4 MG/2ML IJ SOLN
4.0000 mg | Freq: Once | INTRAMUSCULAR | Status: AC
  Administered 2019-03-21: 18:00:00 4 mg via INTRAVENOUS
  Filled 2019-03-21: qty 2

## 2019-03-21 MED ORDER — IBUPROFEN 800 MG PO TABS: 800.0000 mg | ORAL_TABLET | Freq: Four times a day (QID) | ORAL | 0 refills | Status: DC | PRN

## 2019-03-21 MED ORDER — IOHEXOL 300 MG/ML  SOLN
100.0000 mL | Freq: Once | INTRAMUSCULAR | Status: AC | PRN
  Administered 2019-03-21: 100 mL via INTRAVENOUS

## 2019-03-21 MED ORDER — FENTANYL CITRATE (PF) 100 MCG/2ML IJ SOLN
50.0000 ug | Freq: Once | INTRAMUSCULAR | Status: AC
  Administered 2019-03-21: 50 ug via INTRAVENOUS
  Filled 2019-03-21: qty 2

## 2019-03-21 MED ORDER — TRAMADOL HCL 50 MG PO TABS: 50.0000 mg | ORAL_TABLET | Freq: Four times a day (QID) | ORAL | 0 refills | Status: DC | PRN

## 2019-03-21 NOTE — Discharge Instructions (Signed)
Take motrin for pain.   Take tramadol for severe pain.   Expect to be stiff and sore for several days   See your doctor  Use crutches as needed   Return to ER if you have worse back pain, flank pain, blood in urine, vomiting.

## 2019-03-21 NOTE — ED Notes (Signed)
ED Provider at bedside. 
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
## 2019-03-21 NOTE — ED Notes (Signed)
This RN acting as Statistician and asked pt if he would like for me to call his family- pt wanted me to speak with mom and gf. Pt's mom and gf in waiting room. So I spoke with them in person, and was able to give them an update. Then I called his mom and was able to let him facetime with mom and gf.

## 2019-03-21 NOTE — ED Notes (Signed)
Patient transported to CT 

## 2019-03-21 NOTE — ED Notes (Signed)
Wife called, RN gave phone to pt so that he could update her.
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
## 2019-03-22 ENCOUNTER — Encounter: Payer: Self-pay | Admitting: Emergency Medicine

## 2019-05-06 ENCOUNTER — Other Ambulatory Visit: Payer: Self-pay

## 2019-05-06 ENCOUNTER — Emergency Department (HOSPITAL_COMMUNITY)
Admission: EM | Admit: 2019-05-06 | Discharge: 2019-05-06 | Disposition: A | Discharge status: Home / Self Care | Payer: Medicaid Other | Attending: Emergency Medicine | Admitting: Emergency Medicine

## 2019-05-06 ENCOUNTER — Emergency Department (HOSPITAL_COMMUNITY): Payer: Medicaid Other

## 2019-05-06 ENCOUNTER — Encounter (HOSPITAL_COMMUNITY): Payer: Self-pay | Admitting: Emergency Medicine

## 2019-05-06 DIAGNOSIS — R0789 Other chest pain: Secondary | ICD-10-CM | POA: Insufficient documentation

## 2019-05-06 DIAGNOSIS — F17210 Nicotine dependence, cigarettes, uncomplicated: Secondary | ICD-10-CM | POA: Diagnosis not present

## 2019-05-06 DIAGNOSIS — Z87820 Personal history of traumatic brain injury: Secondary | ICD-10-CM | POA: Diagnosis not present

## 2019-05-06 DIAGNOSIS — T754XXA Electrocution, initial encounter: Secondary | ICD-10-CM | POA: Insufficient documentation

## 2019-05-06 DIAGNOSIS — J45909 Unspecified asthma, uncomplicated: Secondary | ICD-10-CM | POA: Insufficient documentation

## 2019-05-06 DIAGNOSIS — Z79899 Other long term (current) drug therapy: Secondary | ICD-10-CM | POA: Diagnosis not present

## 2019-05-06 LAB — BASIC METABOLIC PANEL
Anion gap: 10 (ref 5–15)
BUN: 20 mg/dL (ref 6–20)
CO2: 21 mmol/L — ABNORMAL LOW (ref 22–32)
Calcium: 9.1 mg/dL (ref 8.9–10.3)
Chloride: 108 mmol/L (ref 98–111)
Creatinine, Ser: 0.74 mg/dL (ref 0.61–1.24)
GFR calc Af Amer: >60 mL/min (ref >60)
GFR calc non Af Amer: >60 mL/min (ref >60)
Glucose, Bld: 111 mg/dL — ABNORMAL HIGH (ref 70–99)
Potassium: 3.9 mmol/L (ref 3.5–5.1)
Sodium: 139 mmol/L (ref 135–145)

## 2019-05-06 LAB — CBC
HCT: 47 % (ref 39.0–52.0)
Hemoglobin: 16.1 g/dL (ref 13.0–17.0)
MCH: 29.5 pg (ref 26.0–34.0)
MCHC: 34.3 g/dL (ref 30.0–36.0)
MCV: 86.1 fL (ref 80.0–100.0)
Platelets: 246 10*3/uL (ref 150–400)
RBC: 5.46 MIL/uL (ref 4.22–5.81)
RDW: 12.1 % (ref 11.5–15.5)
WBC: 7 10*3/uL (ref 4.0–10.5)
nRBC: 0 % (ref 0.0–0.2)

## 2019-05-06 LAB — CK: Total CK: 70 U/L (ref 49–397)

## 2019-05-06 LAB — D-DIMER, QUANTITATIVE: D-Dimer, Quant: 0.32 ug/mL-FEU (ref 0.00–0.50)

## 2019-05-06 LAB — TROPONIN I: Troponin I: <0.03 ng/mL (ref <0.03)

## 2019-05-06 MED ORDER — IBUPROFEN 600 MG PO TABS: 600.0000 mg | ORAL_TABLET | Freq: Four times a day (QID) | ORAL | 0 refills | Status: DC | PRN

## 2019-05-06 NOTE — ED Notes (Signed)
Discharge instructions provided , pharmacy reviewed with patient and followup care.
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
## 2019-05-06 NOTE — ED Notes (Signed)
Patient complains chest hurts when he breaths deep, this morning he had some coughing with red blood.  No fevers at this time.

## 2019-05-06 NOTE — ED Triage Notes (Signed)
Pt in POV reporting CP/SOB that woke him up from sleep. Central, non radiating, tight in nature, 5/10. States the pain worsens with a deep breath, also reports he coughed up blood PTA. Pt in NAD.
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
## 2019-05-23 ENCOUNTER — Telehealth: Payer: Self-pay | Admitting: Neurology

## 2019-05-23 ENCOUNTER — Institutional Professional Consult (permissible substitution): Payer: Medicaid Other | Admitting: Neurology

## 2019-05-23 NOTE — Telephone Encounter (Signed)
This patient did not show for a new patient appointment today. 

## 2019-05-28 ENCOUNTER — Encounter: Payer: Self-pay | Admitting: Neurology

## 2021-01-13 ENCOUNTER — Emergency Department (HOSPITAL_COMMUNITY)
Admission: EM | Admit: 2021-01-13 | Discharge: 2021-01-14 | Disposition: A | Discharge status: Home / Self Care | Payer: Medicaid Other | Attending: Emergency Medicine | Admitting: Emergency Medicine

## 2021-01-13 ENCOUNTER — Encounter (HOSPITAL_COMMUNITY): Payer: Self-pay

## 2021-01-13 DIAGNOSIS — T2661XA Corrosion of cornea and conjunctival sac, right eye, initial encounter: Secondary | ICD-10-CM | POA: Insufficient documentation

## 2021-01-13 DIAGNOSIS — H10213 Acute toxic conjunctivitis, bilateral: Secondary | ICD-10-CM

## 2021-01-13 DIAGNOSIS — Y92009 Unspecified place in unspecified non-institutional (private) residence as the place of occurrence of the external cause: Secondary | ICD-10-CM | POA: Insufficient documentation

## 2021-01-13 DIAGNOSIS — F17210 Nicotine dependence, cigarettes, uncomplicated: Secondary | ICD-10-CM | POA: Insufficient documentation

## 2021-01-13 DIAGNOSIS — J45909 Unspecified asthma, uncomplicated: Secondary | ICD-10-CM | POA: Diagnosis not present

## 2021-01-13 DIAGNOSIS — W409XXA Explosion of unspecified explosive materials, initial encounter: Secondary | ICD-10-CM | POA: Insufficient documentation

## 2021-01-13 DIAGNOSIS — T542X1A Toxic effect of corrosive acids and acid-like substances, accidental (unintentional), initial encounter: Secondary | ICD-10-CM | POA: Diagnosis not present

## 2021-01-13 DIAGNOSIS — S0590XA Unspecified injury of unspecified eye and orbit, initial encounter: Secondary | ICD-10-CM | POA: Diagnosis present

## 2021-01-13 DIAGNOSIS — T2662XA Corrosion of cornea and conjunctival sac, left eye, initial encounter: Secondary | ICD-10-CM | POA: Diagnosis not present

## 2021-01-13 MED ORDER — FLUORESCEIN SODIUM 1 MG OP STRP
1.0000 | ORAL_STRIP | Freq: Once | OPHTHALMIC | Status: DC
  Filled 2021-01-13: qty 1

## 2021-01-13 MED ORDER — TETRACAINE HCL 0.5 % OP SOLN
2.0000 [drp] | Freq: Once | OPHTHALMIC | Status: DC
  Filled 2021-01-13: qty 4
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
## 2021-01-13 NOTE — ED Notes (Signed)
Pt taken to eye wash station

## 2021-01-14 MED ORDER — ERYTHROMYCIN 5 MG/GM OP OINT
1.0000 "application " | TOPICAL_OINTMENT | Freq: Four times a day (QID) | OPHTHALMIC | Status: DC
  Administered 2021-01-14: 1 via OPHTHALMIC
  Filled 2021-01-14: qty 3.5
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
## 2022-02-22 ENCOUNTER — Emergency Department: Payer: Medicaid Other

## 2022-02-22 ENCOUNTER — Emergency Department
Admission: EM | Admit: 2022-02-22 | Discharge: 2022-02-22 | Disposition: A | Discharge status: Home / Self Care | Payer: Medicaid Other | Attending: Emergency Medicine | Admitting: Emergency Medicine

## 2022-02-22 ENCOUNTER — Encounter: Payer: Self-pay | Admitting: Emergency Medicine

## 2022-02-22 DIAGNOSIS — M79641 Pain in right hand: Secondary | ICD-10-CM | POA: Diagnosis not present

## 2022-02-22 DIAGNOSIS — X500XXA Overexertion from strenuous movement or load, initial encounter: Secondary | ICD-10-CM | POA: Diagnosis not present

## 2022-02-22 DIAGNOSIS — G8911 Acute pain due to trauma: Secondary | ICD-10-CM | POA: Diagnosis not present

## 2022-02-22 DIAGNOSIS — M25511 Pain in right shoulder: Secondary | ICD-10-CM

## 2022-02-22 MED ORDER — OXYCODONE-ACETAMINOPHEN 5-325 MG PO TABS: 1.0000 | ORAL_TABLET | Freq: Four times a day (QID) | ORAL | 0 refills | Status: AC | PRN

## 2022-02-22 MED ORDER — OXYCODONE HCL 5 MG PO TABS
10.0000 mg | ORAL_TABLET | Freq: Once | ORAL | Status: AC
  Administered 2022-02-22: 10 mg via ORAL
  Filled 2022-02-22: qty 2

## 2022-02-22 MED ORDER — CYCLOBENZAPRINE HCL 10 MG PO TABS: 10.0000 mg | ORAL_TABLET | Freq: Three times a day (TID) | ORAL | 0 refills | Status: AC | PRN

## 2022-02-22 MED ORDER — CYCLOBENZAPRINE HCL 10 MG PO TABS
5.0000 mg | ORAL_TABLET | Freq: Once | ORAL | Status: AC
  Administered 2022-02-22: 5 mg via ORAL
  Filled 2022-02-22: qty 1

## 2022-02-22 NOTE — Discharge Instructions (Addendum)
-  Take Tylenol/ibuprofen as needed for pain.  Utilize oxycodone sparingly. ?-You may take cyclobenzaprine as a muscle relaxer.  It may cause you to be sleepy/drowsy.  Use caution when operating machinery while on it ?-Follow-up with the orthopedist listed above. ?-Return to the emergency department anytime if you begin to experience any new or worsening symptoms. ?

## 2022-02-22 NOTE — ED Notes (Signed)
Pt provided an ice pack.  
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
## 2022-02-22 NOTE — ED Notes (Signed)
E-signature pad unavailable - Pt verbalized understanding of D/C information - no additional concerns at this time.  
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
## 2022-05-26 ENCOUNTER — Emergency Department: Payer: Medicaid Other

## 2022-05-26 ENCOUNTER — Encounter: Payer: Self-pay | Admitting: Emergency Medicine

## 2022-05-26 ENCOUNTER — Emergency Department
Admission: EM | Admit: 2022-05-26 | Discharge: 2022-05-26 | Discharge status: Against Medical Advice | Payer: Medicaid Other | Attending: Student | Admitting: Student

## 2022-05-26 DIAGNOSIS — R6883 Chills (without fever): Secondary | ICD-10-CM | POA: Diagnosis not present

## 2022-05-26 DIAGNOSIS — Z5321 Procedure and treatment not carried out due to patient leaving prior to being seen by health care provider: Secondary | ICD-10-CM | POA: Insufficient documentation

## 2022-05-26 DIAGNOSIS — F419 Anxiety disorder, unspecified: Secondary | ICD-10-CM | POA: Insufficient documentation

## 2022-05-26 DIAGNOSIS — R079 Chest pain, unspecified: Secondary | ICD-10-CM | POA: Diagnosis not present

## 2022-05-26 DIAGNOSIS — R42 Dizziness and giddiness: Secondary | ICD-10-CM | POA: Diagnosis present

## 2022-05-26 LAB — URINALYSIS, ROUTINE W REFLEX MICROSCOPIC
Bilirubin Urine: NEGATIVE
Glucose, UA: NEGATIVE mg/dL
Hgb urine dipstick: NEGATIVE
Ketones, ur: NEGATIVE mg/dL
Leukocytes,Ua: NEGATIVE
Nitrite: NEGATIVE
Protein, ur: NEGATIVE mg/dL
Specific Gravity, Urine: 1.001 — ABNORMAL LOW (ref 1.005–1.030)
pH: 7 (ref 5.0–8.0)

## 2022-05-26 LAB — BASIC METABOLIC PANEL
Anion gap: 10 (ref 5–15)
BUN: 17 mg/dL (ref 6–20)
CO2: 20 mmol/L — ABNORMAL LOW (ref 22–32)
Calcium: 9.2 mg/dL (ref 8.9–10.3)
Chloride: 109 mmol/L (ref 98–111)
Creatinine, Ser: 0.84 mg/dL (ref 0.61–1.24)
GFR, Estimated: >60 mL/min (ref >60)
Glucose, Bld: 135 mg/dL — ABNORMAL HIGH (ref 70–99)
Potassium: 3.6 mmol/L (ref 3.5–5.1)
Sodium: 139 mmol/L (ref 135–145)

## 2022-05-26 LAB — CBC WITH DIFFERENTIAL/PLATELET
Abs Immature Granulocytes: 0.02 10*3/uL (ref 0.00–0.07)
Basophils Absolute: 0.1 10*3/uL (ref 0.0–0.1)
Basophils Relative: 1 %
Eosinophils Absolute: 0.3 10*3/uL (ref 0.0–0.5)
Eosinophils Relative: 3 %
HCT: 48 % (ref 39.0–52.0)
Hemoglobin: 16 g/dL (ref 13.0–17.0)
Immature Granulocytes: 0 %
Lymphocytes Relative: 29 %
Lymphs Abs: 3 10*3/uL (ref 0.7–4.0)
MCH: 28.1 pg (ref 26.0–34.0)
MCHC: 33.3 g/dL (ref 30.0–36.0)
MCV: 84.4 fL (ref 80.0–100.0)
Monocytes Absolute: 0.6 10*3/uL (ref 0.1–1.0)
Monocytes Relative: 6 %
Neutro Abs: 6.2 10*3/uL (ref 1.7–7.7)
Neutrophils Relative %: 61 %
Platelets: 279 10*3/uL (ref 150–400)
RBC: 5.69 MIL/uL (ref 4.22–5.81)
RDW: 12 % (ref 11.5–15.5)
WBC: 10.1 10*3/uL (ref 4.0–10.5)
nRBC: 0 % (ref 0.0–0.2)

## 2022-05-26 LAB — TROPONIN I (HIGH SENSITIVITY): Troponin I (High Sensitivity): 3 ng/L (ref <18)

## 2022-05-26 LAB — CK: Total CK: 51 U/L (ref 49–397)

## 2022-05-26 NOTE — ED Provider Triage Note (Signed)
Emergency Medicine Provider Triage Evaluation Note  Joshua [name] , a 21 y.o. male  was evaluated in triage.  Pt complains of lightheaded and ears ringing for 5 days. Was out in the heat over the weekend. No falls or HS. Reports "my anxiety isn't helping"  Review of Systems  Positive: Cp, dizzy Negative: Abd pain, back pain  Physical Exam  BP (!) 146/87 (BP Location: Left Arm)   Pulse 87   Temp 98.1 F (36.7 C) (Oral)   Resp 16   SpO2 99%  Gen:   Awake, no distress   Resp:  Normal effort  MSK:   Moves extremities without difficulty  Other:  Very anxious  Medical Decision Making  Medically screening exam initiated at 7:06 PM.  Appropriate orders placed.  Gagandeep Pettet was informed that the remainder of the evaluation will be completed by another provider, this initial triage assessment does not replace that evaluation, and the importance of remaining in the ED until their evaluation is complete.     Keturah Shavers 05/26/22 1910

## 2022-05-26 NOTE — ED Triage Notes (Signed)
Pt presents via POV with complaints of CP, dizziness, lightheadedness, and chills for the last 5 days. [place] and since that time he has had increase in sx. Hx of anxiety - not currently on medication. Denies LOC, falls, hitting his head.

## 2022-05-27 ENCOUNTER — Telehealth: Payer: Medicaid Other | Admitting: Physician Assistant

## 2022-05-27 DIAGNOSIS — R42 Dizziness and giddiness: Secondary | ICD-10-CM

## 2022-05-27 DIAGNOSIS — F419 Anxiety disorder, unspecified: Secondary | ICD-10-CM | POA: Diagnosis not present

## 2022-05-27 DIAGNOSIS — R6889 Other general symptoms and signs: Secondary | ICD-10-CM | POA: Diagnosis not present
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
## 2022-05-27 NOTE — Patient Instructions (Signed)
  Joshua [name], thank you for joining [name], PA-C for today's virtual visit.  While this provider is not your primary care provider (PCP), if your PCP is located in our provider database this encounter information will be shared with them immediately following your visit.  Consent: (Patient) Joshua [name] provided verbal consent for this virtual visit at the beginning of the encounter.  Current Medications:  Current Outpatient Medications:    guaiFENesin (MUCINEX) 600 MG 12 hr tablet, Take 600 mg by mouth 2 (two) times daily., Disp: , Rfl:    ibuprofen (ADVIL) 600 MG tablet, Take 1 tablet (600 mg total) by mouth every 6 (six) hours as needed., Disp: 30 tablet, Rfl: 0   ibuprofen (ADVIL) 800 MG tablet, Take 1 tablet (800 mg total) by mouth every 6 (six) hours as needed., Disp: 21 tablet, Rfl: 0   loperamide (IMODIUM) 2 MG capsule, Take 1 capsule (2 mg total) by mouth 4 (four) times daily as needed for diarrhea or loose stools., Disp: 12 capsule, Rfl: 0   metoCLOPramide (REGLAN) 10 MG tablet, Take 1 tablet (10 mg total) by mouth every 6 (six) hours., Disp: 10 tablet, Rfl: 0   Medications ordered in this encounter:  No orders of the defined types were placed in this encounter.    *If you need refills on other medications prior to your next appointment, please contact your pharmacy*  Follow-Up: Call back or seek an in-person evaluation if the symptoms worsen or if the condition fails to improve as anticipated.  Other Instructions Please have your girlfriend carry you now for evaluation as discussed. Anything worsening -- call EMS.    If you have been instructed to have an in-person evaluation today at a local Urgent Care facility, please use the link below. It will take you to a list of all of our available [facility], including address, phone number and hours of operation. Please do not delay care.  [facility]  If you or a family member do not have  a primary care provider, use the link below to schedule a visit and establish care. When you choose a [facility] primary care physician or advanced practice provider, you gain a long-term partner in health. Find a Primary Care Provider  Learn more about [facility]'s in-office and virtual care options: [facility] Get Care Now

## 2022-05-29 ENCOUNTER — Emergency Department (HOSPITAL_COMMUNITY): Payer: Medicaid Other

## 2022-05-29 ENCOUNTER — Emergency Department (HOSPITAL_COMMUNITY)
Admission: EM | Admit: 2022-05-29 | Discharge: 2022-05-30 | Disposition: A | Discharge status: Home / Self Care | Payer: Medicaid Other | Attending: Emergency Medicine | Admitting: Emergency Medicine

## 2022-05-29 ENCOUNTER — Other Ambulatory Visit: Payer: Self-pay

## 2022-05-29 DIAGNOSIS — J45909 Unspecified asthma, uncomplicated: Secondary | ICD-10-CM | POA: Insufficient documentation

## 2022-05-29 DIAGNOSIS — R42 Dizziness and giddiness: Secondary | ICD-10-CM | POA: Insufficient documentation

## 2022-05-29 LAB — CBC WITH DIFFERENTIAL/PLATELET
Abs Immature Granulocytes: 0.02 10*3/uL (ref 0.00–0.07)
Basophils Absolute: 0.1 10*3/uL (ref 0.0–0.1)
Basophils Relative: 1 %
Eosinophils Absolute: 0.1 10*3/uL (ref 0.0–0.5)
Eosinophils Relative: 1 %
HCT: 47.7 % (ref 39.0–52.0)
Hemoglobin: 16.6 g/dL (ref 13.0–17.0)
Immature Granulocytes: 0 %
Lymphocytes Relative: 19 %
Lymphs Abs: 1.9 10*3/uL (ref 0.7–4.0)
MCH: 29.1 pg (ref 26.0–34.0)
MCHC: 34.8 g/dL (ref 30.0–36.0)
MCV: 83.5 fL (ref 80.0–100.0)
Monocytes Absolute: 0.5 10*3/uL (ref 0.1–1.0)
Monocytes Relative: 5 %
Neutro Abs: 7.5 10*3/uL (ref 1.7–7.7)
Neutrophils Relative %: 74 %
Platelets: 289 10*3/uL (ref 150–400)
RBC: 5.71 MIL/uL (ref 4.22–5.81)
RDW: 11.9 % (ref 11.5–15.5)
WBC: 10.2 10*3/uL (ref 4.0–10.5)
nRBC: 0 % (ref 0.0–0.2)

## 2022-05-29 LAB — URINALYSIS, ROUTINE W REFLEX MICROSCOPIC
Bacteria, UA: NONE SEEN
Bilirubin Urine: NEGATIVE
Glucose, UA: NEGATIVE mg/dL
Ketones, ur: NEGATIVE mg/dL
Leukocytes,Ua: NEGATIVE
Nitrite: NEGATIVE
Protein, ur: NEGATIVE mg/dL
Specific Gravity, Urine: 1.01 (ref 1.005–1.030)
pH: 8 (ref 5.0–8.0)

## 2022-05-29 LAB — COMPREHENSIVE METABOLIC PANEL
ALT: 41 U/L (ref 0–44)
AST: 22 U/L (ref 15–41)
Albumin: 4.2 g/dL (ref 3.5–5.0)
Alkaline Phosphatase: 73 U/L (ref 38–126)
Anion gap: 11 (ref 5–15)
BUN: 11 mg/dL (ref 6–20)
CO2: 21 mmol/L — ABNORMAL LOW (ref 22–32)
Calcium: 10 mg/dL (ref 8.9–10.3)
Chloride: 108 mmol/L (ref 98–111)
Creatinine, Ser: 0.86 mg/dL (ref 0.61–1.24)
GFR, Estimated: >60 mL/min (ref >60)
Glucose, Bld: 124 mg/dL — ABNORMAL HIGH (ref 70–99)
Potassium: 3.6 mmol/L (ref 3.5–5.1)
Sodium: 140 mmol/L (ref 135–145)
Total Bilirubin: 0.5 mg/dL (ref 0.3–1.2)
Total Protein: 7.4 g/dL (ref 6.5–8.1)

## 2022-05-29 LAB — CK: Total CK: 33 U/L — ABNORMAL LOW (ref 49–397)

## 2022-05-29 NOTE — ED Provider Triage Note (Signed)
Emergency Medicine Provider Triage Evaluation Note  Joshua [name] , a 21 y.o. male  was evaluated in triage.  Pt complains of several days of orthostatic lightheadedness, tension headache, dark urine.  He has a history of closed head injury.  He was seen at [facility] ED and had labs drawn which were reassuring.  States that he did feel better temporarily after receiving IV fluids.  Today he had a syncopal episode after standing up.  Review of Systems  Positive: Dizziness Negative: Fever  Physical Exam  BP 139/81 (BP Location: Left Arm)   Pulse 91   Temp 98.3 F (36.8 C) (Oral)   Resp 18   SpO2 96%  Gen:   Awake, no distress   Resp:  Normal effort  MSK:   Moves extremities without difficulty  Other:  Seems anxious  Medical Decision Making  Medically screening exam initiated at 4:26 PM.  Appropriate orders placed.  Joshua [name] was informed that the remainder of the evaluation will be completed by another provider, this initial triage assessment does not replace that evaluation, and the importance of remaining in the ED until their evaluation is complete.     Renne Crigler, PA-C 05/29/22 1627

## 2022-05-29 NOTE — ED Triage Notes (Signed)
Reported near syncopal episodes the last few days; reported new prescription of Buspirone taking 3 x a day; reports given Promethazine by PCP as well last Friday as well.

## 2022-05-30 ENCOUNTER — Other Ambulatory Visit: Payer: Self-pay

## 2022-05-30 ENCOUNTER — Encounter (HOSPITAL_COMMUNITY): Payer: Self-pay

## 2022-05-30 MED ORDER — LACTATED RINGERS IV BOLUS
1000.0000 mL | Freq: Once | INTRAVENOUS | Status: AC
  Administered 2022-05-30: 1000 mL via INTRAVENOUS

## 2022-05-30 MED ORDER — IBUPROFEN 400 MG PO TABS
600.0000 mg | ORAL_TABLET | Freq: Once | ORAL | Status: AC
  Administered 2022-05-30: 600 mg via ORAL
  Filled 2022-05-30: qty 1

## 2022-05-30 NOTE — Discharge Instructions (Signed)
You are seen here today for your lightheadedness and dizziness.  Your blood work and CT scan were very reassuring.  Suspect you are experiencing some lightheadedness secondary to your new medication and also possibly to mild dehydration.  You are given fluids in the emergency department.  Please follow-up with your primary care doctor and return to the ER with any severe symptoms.

## 2022-05-30 NOTE — ED Notes (Signed)
Patient verbalizes understanding of d/c instructions. Opportunities for questions and answers were provided. Pt d/c from ED and wheeled to lobby with mother. 

## 2022-05-30 NOTE — ED Provider Notes (Signed)
[facility] EMERGENCY DEPARTMENT Provider Note   CSN: [id number] Arrival date & time: 05/29/22  1547     History  Chief Complaint  Patient presents with   Dizziness    Joshua [name] is a 21 y.o. male.  Who presents with his family at the bedside with concern for ongoing intermittent lightheadedness and severe anxiety.  Patient was seen at [facility] emergency department by PA in triage for lightheadedness and concern for dehydration with dark urine.  Had normal laboratory studies at that time and left the emergency department prior to completing his evaluation.  Was seen in a video visit by his primary care doctor. The next morning, received IV fluid resuscitation per patient.  Of note patient was recently started on BuSpar for anxiety.  States that previously self medicated with nicotine via cigarettes, vaping, and as well as smoking marijuana.  Recently stopped all of those "cold [place]" and was prescribed BuSpar by his PCP. Significant improvement in his lightheadedness and anxiety after administration of IV fluids yesterday, however recurrence this evening with near syncopal episode after standing.  Endorses that he feels the symptoms are primarily secondary to his anxiety as he only been on BuSpar for 2 days.  Denies any chest pain or palpitations.  No history of cardiac abnormality in the past. NO syncopal episodes, only near syncopal.   I personally read this patient's medical records.  History of spinal fracture, asthma, closed head injuries in the past and migraines.  He is not any medications daily aside from BuSpar.  Does have prescription for meclizine per PCP for recent episodes of dizziness, which the patient describes to me as lightheadedness as opposed to spinning of the room.  HPI     Home Medications Prior to Admission medications   Medication Sig Start Date End Date Taking? Authorizing Provider  guaiFENesin (MUCINEX) 600 MG 12 hr tablet Take 600 mg by mouth 2  (two) times daily.    [provider]  ibuprofen (ADVIL) 600 MG tablet Take 1 tablet (600 mg total) by mouth every 6 (six) hours as needed. 05/06/19   Charlesetta Shanks, MD  ibuprofen (ADVIL) 800 MG tablet Take 1 tablet (800 mg total) by mouth every 6 (six) hours as needed. 03/21/19   Drenda Freeze, MD  loperamide (IMODIUM) 2 MG capsule Take 1 capsule (2 mg total) by mouth 4 (four) times daily as needed for diarrhea or loose stools. 05/30/18   Recardo Evangelist, PA-C  metoCLOPramide (REGLAN) 10 MG tablet Take 1 tablet (10 mg total) by mouth every 6 (six) hours. 05/30/18   Recardo Evangelist, PA-C      Allergies    Penicillins and Zofran Alvis Lemmings hcl]    Review of Systems   Review of Systems  Constitutional: Negative.   HENT: Negative.    Eyes: Negative.   Respiratory: Negative.    Cardiovascular: Negative.   Gastrointestinal: Negative.   Genitourinary:  Negative for decreased urine volume.  Musculoskeletal: Negative.   Neurological:  Positive for light-headedness.  Psychiatric/Behavioral:  The patient is nervous/anxious.     Physical Exam Updated Vital Signs BP 123/65   Pulse 68   Temp 98.1 F (36.7 C) (Oral)   Resp 16   SpO2 98%  Physical Exam Vitals and nursing note reviewed.  Constitutional:      Appearance: He is obese. He is not ill-appearing or toxic-appearing.  HENT:     Head: Normocephalic and atraumatic.     Nose: Nose normal.  Mouth/Throat:     Mouth: Mucous membranes are moist.     Pharynx: No oropharyngeal exudate or posterior oropharyngeal erythema.  Eyes:     General:        Right eye: No discharge.        Left eye: No discharge.     Extraocular Movements: Extraocular movements intact.     Conjunctiva/sclera: Conjunctivae normal.     Pupils: Pupils are equal, round, and reactive to light.  Cardiovascular:     Rate and Rhythm: Normal rate and regular rhythm.     Pulses: Normal pulses.     Heart sounds: Normal heart sounds. No murmur  heard. Pulmonary:     Effort: Pulmonary effort is normal. No respiratory distress.     Breath sounds: Normal breath sounds. No wheezing or rales.  Abdominal:     General: Bowel sounds are normal. There is no distension.     Palpations: Abdomen is soft.     Tenderness: There is no abdominal tenderness. There is no right CVA tenderness, left CVA tenderness, guarding or rebound.  Musculoskeletal:        General: No deformity.     Cervical back: Normal range of motion and neck supple.     Right lower leg: No edema.     Left lower leg: No edema.  Skin:    General: Skin is warm and dry.     Capillary Refill: Capillary refill takes less than 2 seconds.  Neurological:     General: No focal deficit present.     Mental Status: He is alert and oriented to person, place, and time. Mental status is at baseline.     GCS: GCS eye subscore is 4. GCS verbal subscore is 5. GCS motor subscore is 6.     Cranial Nerves: Cranial nerves 2-12 are intact.     Sensory: Sensation is intact.     Motor: Motor function is intact.     Coordination: Coordination is intact.     Gait: Gait is intact.  Psychiatric:        Mood and Affect: Mood normal.     ED Results / Procedures / Treatments   Labs (all labs ordered are listed, but only abnormal results are displayed) Labs Reviewed  COMPREHENSIVE METABOLIC PANEL - Abnormal; Notable for the following components:      Result Value   CO2 21 (*)    Glucose, Bld 124 (*)    All other components within normal limits  CK - Abnormal; Notable for the following components:   Total CK 33 (*)    All other components within normal limits  URINALYSIS, ROUTINE W REFLEX MICROSCOPIC - Abnormal; Notable for the following components:   Hgb urine dipstick MODERATE (*)    All other components within normal limits  CBC WITH DIFFERENTIAL/PLATELET    EKG EKG Interpretation  Date/Time:  Monday May 30 2022 00:54:03 EDT Ventricular Rate:  68 PR Interval:  148 QRS  Duration: 74 QT Interval:  360 QTC Calculation: 382 R Axis:   67 Text Interpretation: Normal sinus rhythm with sinus arrhythmia Normal ECG When compared with ECG of 29-May-2022 16:20, Poor data quality, interpretation may be adversely affected Confirmed by Tilden Fossa [id number]) on 05/30/2022 1:10:46 AM  Radiology DG Chest 2 View  Result Date: 05/29/2022 CLINICAL DATA:  Syncope EXAM: CHEST - 2 VIEW COMPARISON:  May 26, 2022 FINDINGS: Minimal atelectasis or scar in the lateral left lung base. The heart, hila, mediastinum, lungs, and pleura are  otherwise unremarkable. IMPRESSION: No active cardiopulmonary disease. Electronically Signed   By: Gerome Sam III M.D.   On: 05/29/2022 19:07   CT HEAD WO CONTRAST ( )  Result Date: 05/29/2022 CLINICAL DATA:  Headache, recent fall, syncope EXAM: CT HEAD WITHOUT CONTRAST TECHNIQUE: Contiguous axial images were obtained from the base of the skull through the vertex without intravenous contrast. RADIATION DOSE REDUCTION: This exam was performed according to the departmental dose-optimization program which includes automated exposure control, adjustment of the mA and/or kV according to patient size and/or use of iterative reconstruction technique. COMPARISON:  03/21/2019 FINDINGS: Brain: No acute infarct or hemorrhage. Lateral ventricles and midline structures are unremarkable. No acute extra-axial fluid collections. No mass effect. Vascular: No hyperdense vessel or unexpected calcification. Skull: Normal. Negative for fracture or focal lesion. Sinuses/Orbits: Mild mucoperiosteal thickening within the left maxillary sinus and ethmoid air cells. No gas fluid levels. Other: None. IMPRESSION: 1. Stable head CT, no acute intracranial process. Electronically Signed   By: Sharlet Salina M.D.   On: 05/29/2022 17:06    Procedures Procedures    Medications Ordered in ED Medications  lactated ringers bolus 1,000 mL (0 mLs Intravenous Stopped 05/30/22 0216)   ibuprofen (ADVIL) tablet 600 mg (600 mg Oral Given 05/30/22 0121)    ED Course/ Medical Decision Making/ A&P                           Medical Decision Making 21 year-old male with anxiety and lightheadedness.   VS normal throughout long wait in the waiting room. Cardiopulmonary and neurologic exam is normal. Abdominal exam is benign. Appears anxious.  The differential for syncope is extensive and includes, but is not limited to: arrythmia (Vtach, SVT, SSS, sinus arrest, AV block, bradycardia), aortic stenosis, AMI, hypertrophic obstructive cardiomyopathy (HOCM), PE, atrial myxoma, pulmonary hypertension, orthostatic hypotension, hypovolemia, drug effect, GB syndrome, micturition, cough, carotid sinus sensitivity, seizure, TIA/CVA, hypoglycemia, vertigo.   Amount and/or Complexity of Data Reviewed Radiology:     Details: Chest x-ray visualized this provider and is negative for cardiopulmonary disease.  CT head also visualized this provider and is negative for acute intracranial abnormality.. ECG/medicine tests: ordered and independent interpretation performed.    Details: EKG with NSR.   Clinical picture most consistent with orthostatic hypotension with lightheadedness; possible contribution of recent new behavioral health medication contributing to his lightheadedness.  Will administer fluid bolus and reevaluate, however clinical concern for emergent etiology for this patient symptoms remains very low.  Patient reevaluated after IV fluid bolus with significant prevent symptoms, anxiety improved after our discussion today.  Do recommend he discontinue his promethazine as this could be contributing to his lightheadedness.  May continue BuSpar with the understanding that we will take some time for his body to adjust and his anxiety to be more well controlled.  Recommend close outpatient follow-up with his PCP. Ambulating normally, tolerating PO.  Harbert and his family  voiced understanding of  his medical evaluation and treatment plan. Each of their questions answered to their expressed satisfaction.  Return precautions were given.  Patient is well-appearing, stable, and was discharged in good condition..  This chart was dictated using voice recognition software, Dragon. Despite the best efforts of this provider to proofread and correct errors, errors may still occur which can change documentation meaning.   Final Clinical Impression(s) / ED Diagnoses Final diagnoses:  Orthostatic dizziness    Rx / DC Orders ED Discharge Orders  None         Aura Dials 05/30/22 0441    Quintella Reichert, MD 05/30/22 [phone]

## 2022-05-31 ENCOUNTER — Emergency Department (HOSPITAL_COMMUNITY)
Admission: EM | Admit: 2022-05-31 | Discharge: 2022-05-31 | Disposition: A | Discharge status: Home / Self Care | Payer: Medicaid Other | Attending: Emergency Medicine | Admitting: Emergency Medicine

## 2022-05-31 ENCOUNTER — Other Ambulatory Visit: Payer: Self-pay

## 2022-05-31 ENCOUNTER — Encounter (HOSPITAL_COMMUNITY): Payer: Self-pay | Admitting: Emergency Medicine

## 2022-05-31 DIAGNOSIS — Z79899 Other long term (current) drug therapy: Secondary | ICD-10-CM | POA: Diagnosis not present

## 2022-05-31 DIAGNOSIS — J45909 Unspecified asthma, uncomplicated: Secondary | ICD-10-CM | POA: Diagnosis not present

## 2022-05-31 DIAGNOSIS — R42 Dizziness and giddiness: Secondary | ICD-10-CM | POA: Diagnosis not present

## 2022-05-31 DIAGNOSIS — I10 Essential (primary) hypertension: Secondary | ICD-10-CM | POA: Diagnosis not present

## 2022-05-31 DIAGNOSIS — F419 Anxiety disorder, unspecified: Secondary | ICD-10-CM | POA: Diagnosis not present

## 2022-05-31 NOTE — ED Triage Notes (Signed)
Pt states he was seen in ED 2 days ago for anxiety and dizziness.  Taking meclizine and Buspar.  Woke up at 8am and took medication together and went back to sleep.  Woke up at 10:30am feeling anxious and clammy.  Took BP around 11:30 and it read high.  Pt arrived to ED very anxious requesting BP medication.  States he is feeling better since waiting to be triaged and reports just feeling tired.

## 2022-05-31 NOTE — ED Provider Notes (Signed)
[facility] EMERGENCY DEPARTMENT Provider Note   CSN: [id number] Arrival date & time: 05/31/22  1318     History  Chief Complaint  Patient presents with   Anxiety   Dizziness    Joshua [name] is a 21 y.o. male.  Patient with history of T11 spinal fracture, asthma, SAH, and migraines presents today with complaints of anxiety. Patient was just seen for same 24 hours ago with laboratory evaluation and imaging which were unremarkable. Patient was seen at [facility] emergency department by PA in triage on 7/13 for lightheadedness and concern for dehydration with dark urine.  Had normal laboratory studies at that time and left the emergency department prior to completing his evaluation.  Was seen in a video visit by his primary care doctor on 7/14. The next morning, received IV fluid resuscitation per patient.  Of note patient was recently started on BuSpar for anxiety.  States that previously self medicated with nicotine via cigarettes, vaping, and as well as smoking marijuana.  Recently stopped all of those "cold [place]" and was prescribed BuSpar by his PCP. He was also prescribed Zoloft, but states he has not been taking this due to reading the side effects and expressing concerns related to same. Endorses that he feels the symptoms are primarily secondary to his anxiety as he only been on BuSpar for a few days.  Denies any chest pain or palpitations.  No history of cardiac abnormality in the past. No syncopal episodes, only near syncopal.   Patient states that today he was in the car and saw something triggering and felt extremely anxious similarly to these previous episodes. States at this time he checked his blood pressure and it was high. He is unable to recall the specific reading. States he presents here for same. Upon my evaluation, patient states that he is feeling completely back to normal and is asymptomatic. Denies chest pain, shortness of breath, headache, nausea, vomiting,  diarrhea, or abdominal pain.  The history is provided by the patient. No language interpreter was used.  Anxiety  Dizziness      Home Medications Prior to Admission medications   Medication Sig Start Date End Date Taking? Authorizing Provider  guaiFENesin (MUCINEX) 600 MG 12 hr tablet Take 600 mg by mouth 2 (two) times daily.    [provider]  ibuprofen (ADVIL) 600 MG tablet Take 1 tablet (600 mg total) by mouth every 6 (six) hours as needed. 05/06/19   Charlesetta Shanks, MD  ibuprofen (ADVIL) 800 MG tablet Take 1 tablet (800 mg total) by mouth every 6 (six) hours as needed. 03/21/19   Drenda Freeze, MD  loperamide (IMODIUM) 2 MG capsule Take 1 capsule (2 mg total) by mouth 4 (four) times daily as needed for diarrhea or loose stools. 05/30/18   Recardo Evangelist, PA-C  metoCLOPramide (REGLAN) 10 MG tablet Take 1 tablet (10 mg total) by mouth every 6 (six) hours. 05/30/18   Recardo Evangelist, PA-C      Allergies    Penicillins and Zofran Alvis Lemmings hcl]    Review of Systems   Review of Systems  All other systems reviewed and are negative.   Physical Exam Updated Vital Signs BP (!) 133/92   Pulse 80   Temp 98.2 F (36.8 C) (Oral)   Resp (!) 24   SpO2 99%  Physical Exam Vitals and nursing note reviewed.  Constitutional:      General: He is not in acute distress.    Appearance:  Normal appearance. He is normal weight. He is not ill-appearing, toxic-appearing or diaphoretic.  HENT:     Head: Normocephalic and atraumatic.  Eyes:     Extraocular Movements: Extraocular movements intact.     Pupils: Pupils are equal, round, and reactive to light.  Cardiovascular:     Rate and Rhythm: Normal rate and regular rhythm.     Pulses: Normal pulses.     Heart sounds: Normal heart sounds.  Pulmonary:     Effort: Pulmonary effort is normal. No respiratory distress.     Breath sounds: Normal breath sounds.  Abdominal:     General: Abdomen is flat.     Palpations:  Abdomen is soft.  Musculoskeletal:        General: Normal range of motion.     Cervical back: Normal range of motion and neck supple.  Skin:    General: Skin is warm and dry.  Neurological:     General: No focal deficit present.     Mental Status: He is alert and oriented to person, place, and time.     Gait: Gait normal.  Psychiatric:        Mood and Affect: Mood normal.        Behavior: Behavior normal.     ED Results / Procedures / Treatments   Labs (all labs ordered are listed, but only abnormal results are displayed) Labs Reviewed - No data to display  EKG None  Radiology DG Chest 2 View  Result Date: 05/29/2022 CLINICAL DATA:  Syncope EXAM: CHEST - 2 VIEW COMPARISON:  May 26, 2022 FINDINGS: Minimal atelectasis or scar in the lateral left lung base. The heart, hila, mediastinum, lungs, and pleura are otherwise unremarkable. IMPRESSION: No active cardiopulmonary disease. Electronically Signed   By: Gerome Sam III M.D.   On: 05/29/2022 19:07   CT HEAD WO CONTRAST ( )  Result Date: 05/29/2022 CLINICAL DATA:  Headache, recent fall, syncope EXAM: CT HEAD WITHOUT CONTRAST TECHNIQUE: Contiguous axial images were obtained from the base of the skull through the vertex without intravenous contrast. RADIATION DOSE REDUCTION: This exam was performed according to the departmental dose-optimization program which includes automated exposure control, adjustment of the mA and/or kV according to patient size and/or use of iterative reconstruction technique. COMPARISON:  03/21/2019 FINDINGS: Brain: No acute infarct or hemorrhage. Lateral ventricles and midline structures are unremarkable. No acute extra-axial fluid collections. No mass effect. Vascular: No hyperdense vessel or unexpected calcification. Skull: Normal. Negative for fracture or focal lesion. Sinuses/Orbits: Mild mucoperiosteal thickening within the left maxillary sinus and ethmoid air cells. No gas fluid levels. Other: None.  IMPRESSION: 1. Stable head CT, no acute intracranial process. Electronically Signed   By: Sharlet Salina M.D.   On: 05/29/2022 17:06    Procedures Procedures    Medications Ordered in ED Medications - No data to display  ED Course/ Medical Decision Making/ A&P                           Medical Decision Making  Patient presents today with complaints of anxiety and hypertension. He is afebrile, non-toxic appearing, and in no acute distress with reassuring vital signs. States that earlier today he began feeling anxious, checked his blood pressure and it was high. States that upon my evaluation he is completely asymptomatic and specifically denies headache, dizziness, blurred vision, chest pain, shortness or breath, nausea, vomiting, or diarrhea. States he predominantly came in with concerns related to  his blood pressure being high. On my evaluation, patients blood pressure is 140/77. Given he is asymptomatic with unremarkable labs drawn 24 hours ago, no further emergent concerns related to this. He is also in a low risk age group with no comorbid conditions. Therefore he is stable for discharge. Educated on the importance of close pcp follow-up to further discuss management of hypertension. He is understanding and amenable with plan, educated on red flag symptoms that would prompt immediate return. Discharged in stable condition.   Final Clinical Impression(s) / ED Diagnoses Final diagnoses:  Anxiety    Rx / DC Orders ED Discharge Orders     None     An After Visit Summary was printed and given to the patient.     Vear Clock 06/03/22 0002    Melene Plan, DO 06/03/22 [phone]

## 2022-05-31 NOTE — Discharge Instructions (Addendum)
I strongly recommend that you schedule an appoint with your primary care doctor to further discuss your symptoms and management.  Return if development of any new or worsening symptoms.

## 2022-11-28 ENCOUNTER — Emergency Department
Admission: EM | Admit: 2022-11-28 | Discharge: 2022-11-28 | Disposition: A | Discharge status: Home / Self Care | Payer: Medicaid Other | Attending: Emergency Medicine | Admitting: Emergency Medicine

## 2022-11-28 ENCOUNTER — Other Ambulatory Visit: Payer: Self-pay

## 2022-11-28 DIAGNOSIS — Z202 Contact with and (suspected) exposure to infections with a predominantly sexual mode of transmission: Secondary | ICD-10-CM | POA: Diagnosis present

## 2022-11-28 DIAGNOSIS — J45909 Unspecified asthma, uncomplicated: Secondary | ICD-10-CM | POA: Insufficient documentation

## 2022-11-28 LAB — URINALYSIS, ROUTINE W REFLEX MICROSCOPIC
Bilirubin Urine: NEGATIVE
Glucose, UA: 50 mg/dL — AB
Hgb urine dipstick: NEGATIVE
Ketones, ur: NEGATIVE mg/dL
Leukocytes,Ua: NEGATIVE
Nitrite: NEGATIVE
Protein, ur: NEGATIVE mg/dL
Specific Gravity, Urine: 1.017 (ref 1.005–1.030)
pH: 7 (ref 5.0–8.0)

## 2022-11-28 LAB — CHLAMYDIA/NGC RT PCR (ARMC ONLY)
Chlamydia Tr: NOT DETECTED
N gonorrhoeae: NOT DETECTED

## 2022-11-28 MED ORDER — AZITHROMYCIN 1 G PO PACK
1.0000 g | PACK | Freq: Once | ORAL | Status: DC
  Filled 2022-11-28: qty 1

## 2022-11-28 MED ORDER — CEFTRIAXONE SODIUM 250 MG IJ SOLR
250.0000 mg | Freq: Once | INTRAMUSCULAR | Status: DC
  Filled 2022-11-28: qty 250

## 2022-11-28 NOTE — ED Triage Notes (Signed)
Thinks that his x-girlfriend was cheating on him and he was having some urgency with urination. Is worried about an std

## 2022-11-28 NOTE — Discharge Instructions (Addendum)
Your test results were negative. Return to the ER for worsening symptoms, persistent vomiting, difficulty breathing or other concerns.
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
## 2023-01-10 ENCOUNTER — Emergency Department (HOSPITAL_COMMUNITY)
Admission: EM | Admit: 2023-01-10 | Discharge: 2023-01-10 | Disposition: A | Discharge status: Home / Self Care | Payer: Medicaid Other | Attending: Emergency Medicine | Admitting: Emergency Medicine

## 2023-01-10 ENCOUNTER — Emergency Department (HOSPITAL_COMMUNITY): Payer: Medicaid Other

## 2023-01-10 ENCOUNTER — Other Ambulatory Visit: Payer: Self-pay

## 2023-01-10 DIAGNOSIS — J45909 Unspecified asthma, uncomplicated: Secondary | ICD-10-CM | POA: Insufficient documentation

## 2023-01-10 DIAGNOSIS — R0789 Other chest pain: Secondary | ICD-10-CM | POA: Insufficient documentation

## 2023-01-10 DIAGNOSIS — F17210 Nicotine dependence, cigarettes, uncomplicated: Secondary | ICD-10-CM | POA: Diagnosis not present

## 2023-01-10 DIAGNOSIS — R079 Chest pain, unspecified: Secondary | ICD-10-CM | POA: Diagnosis present

## 2023-01-10 LAB — CBC
HCT: 49.2 % (ref 39.0–52.0)
Hemoglobin: 16.2 g/dL (ref 13.0–17.0)
MCH: 28.8 pg (ref 26.0–34.0)
MCHC: 32.9 g/dL (ref 30.0–36.0)
MCV: 87.5 fL (ref 80.0–100.0)
Platelets: 312 10*3/uL (ref 150–400)
RBC: 5.62 MIL/uL (ref 4.22–5.81)
RDW: 12.1 % (ref 11.5–15.5)
WBC: 10.3 10*3/uL (ref 4.0–10.5)
nRBC: 0 % (ref 0.0–0.2)

## 2023-01-10 LAB — BASIC METABOLIC PANEL
Anion gap: 11 (ref 5–15)
BUN: 16 mg/dL (ref 6–20)
CO2: 22 mmol/L (ref 22–32)
Calcium: 9.4 mg/dL (ref 8.9–10.3)
Chloride: 105 mmol/L (ref 98–111)
Creatinine, Ser: 0.8 mg/dL (ref 0.61–1.24)
GFR, Estimated: >60 mL/min (ref >60)
Glucose, Bld: 104 mg/dL — ABNORMAL HIGH (ref 70–99)
Potassium: 3.8 mmol/L (ref 3.5–5.1)
Sodium: 138 mmol/L (ref 135–145)

## 2023-01-10 LAB — TROPONIN I (HIGH SENSITIVITY): Troponin I (High Sensitivity): <2 ng/L (ref <18)

## 2023-01-10 MED ORDER — PREDNISONE 10 MG (21) PO TBPK: ORAL_TABLET | Freq: Every day | ORAL | 0 refills | Status: DC

## 2023-01-10 MED ORDER — DEXAMETHASONE SODIUM PHOSPHATE 10 MG/ML IJ SOLN
10.0000 mg | Freq: Once | INTRAMUSCULAR | Status: AC
  Administered 2023-01-10: 10 mg via INTRAMUSCULAR
  Filled 2023-01-10: qty 1
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
## 2023-01-10 NOTE — Discharge Instructions (Signed)
You are seen today in the emergency due to chest pain.  Your workup today was reassuring, take the steroid Dosepak as prescribed help with the wheezing.  Return to the ED if you have constant chest pain, loss of consciousness, fevers, new or concerning symptoms.  Otherwise I will need to see your doctor in the next 2 days for reevaluation.
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
## 2023-02-04 ENCOUNTER — Emergency Department (HOSPITAL_COMMUNITY): Payer: Medicaid Other

## 2023-02-04 ENCOUNTER — Emergency Department (HOSPITAL_COMMUNITY)
Admission: EM | Admit: 2023-02-04 | Discharge: 2023-02-04 | Disposition: A | Discharge status: Home / Self Care | Payer: Medicaid Other | Attending: Emergency Medicine | Admitting: Emergency Medicine

## 2023-02-04 ENCOUNTER — Other Ambulatory Visit: Payer: Self-pay

## 2023-02-04 DIAGNOSIS — J45909 Unspecified asthma, uncomplicated: Secondary | ICD-10-CM | POA: Diagnosis not present

## 2023-02-04 DIAGNOSIS — R0789 Other chest pain: Secondary | ICD-10-CM | POA: Diagnosis not present

## 2023-02-04 DIAGNOSIS — R42 Dizziness and giddiness: Secondary | ICD-10-CM | POA: Diagnosis present

## 2023-02-04 DIAGNOSIS — R202 Paresthesia of skin: Secondary | ICD-10-CM

## 2023-02-04 LAB — RAPID URINE DRUG SCREEN, HOSP PERFORMED
Amphetamines: NOT DETECTED
Barbiturates: NOT DETECTED
Benzodiazepines: NOT DETECTED
Cocaine: NOT DETECTED
Opiates: NOT DETECTED
Tetrahydrocannabinol: NOT DETECTED
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
## 2023-02-05 ENCOUNTER — Other Ambulatory Visit: Payer: Self-pay

## 2023-02-05 ENCOUNTER — Emergency Department (HOSPITAL_COMMUNITY)
Admission: EM | Admit: 2023-02-05 | Discharge: 2023-02-05 | Disposition: A | Discharge status: Home / Self Care | Payer: Medicaid Other | Attending: Emergency Medicine | Admitting: Emergency Medicine

## 2023-02-05 ENCOUNTER — Emergency Department (HOSPITAL_COMMUNITY): Payer: Medicaid Other

## 2023-02-05 DIAGNOSIS — H20049 Secondary noninfectious iridocyclitis, unspecified eye: Secondary | ICD-10-CM | POA: Diagnosis not present

## 2023-02-05 DIAGNOSIS — S0502XA Injury of conjunctiva and corneal abrasion without foreign body, left eye, initial encounter: Secondary | ICD-10-CM | POA: Diagnosis not present

## 2023-02-05 DIAGNOSIS — F17210 Nicotine dependence, cigarettes, uncomplicated: Secondary | ICD-10-CM | POA: Insufficient documentation

## 2023-02-05 DIAGNOSIS — J45909 Unspecified asthma, uncomplicated: Secondary | ICD-10-CM | POA: Insufficient documentation

## 2023-02-05 DIAGNOSIS — S0990XA Unspecified injury of head, initial encounter: Secondary | ICD-10-CM | POA: Diagnosis not present

## 2023-02-05 DIAGNOSIS — M549 Dorsalgia, unspecified: Secondary | ICD-10-CM | POA: Diagnosis not present

## 2023-02-05 DIAGNOSIS — H5712 Ocular pain, left eye: Secondary | ICD-10-CM | POA: Diagnosis present

## 2023-02-05 DIAGNOSIS — H209 Unspecified iridocyclitis: Secondary | ICD-10-CM

## 2023-02-05 MED ORDER — SODIUM CHLORIDE 0.9 % IV BOLUS
1000.0000 mL | Freq: Once | INTRAVENOUS | Status: AC
  Administered 2023-02-05: 1000 mL via INTRAVENOUS

## 2023-02-05 MED ORDER — DIPHENHYDRAMINE HCL 50 MG/ML IJ SOLN
25.0000 mg | Freq: Once | INTRAMUSCULAR | Status: AC
  Administered 2023-02-05: 25 mg via INTRAVENOUS
  Filled 2023-02-05: qty 1

## 2023-02-05 MED ORDER — TETRACAINE HCL 0.5 % OP SOLN
2.0000 [drp] | Freq: Once | OPHTHALMIC | Status: AC
  Administered 2023-02-05: 2 [drp] via OPHTHALMIC
  Filled 2023-02-05: qty 4

## 2023-02-05 MED ORDER — OFLOXACIN 0.3 % OP SOLN: 1.0000 [drp] | Freq: Four times a day (QID) | OPHTHALMIC | 0 refills | Status: DC

## 2023-02-05 MED ORDER — MORPHINE SULFATE 15 MG PO TABS: 7.5000 mg | ORAL_TABLET | ORAL | 0 refills | Status: DC | PRN

## 2023-02-05 MED ORDER — PREDNISOLONE 15 MG/5ML PO SOLN: 15.0000 mg | Freq: Every day | ORAL | 0 refills | Status: AC

## 2023-02-05 MED ORDER — FLUORESCEIN SODIUM 1 MG OP STRP
1.0000 | ORAL_STRIP | Freq: Once | OPHTHALMIC | Status: AC
  Administered 2023-02-05: 1 via OPHTHALMIC
  Filled 2023-02-05: qty 1

## 2023-02-05 MED ORDER — PROCHLORPERAZINE EDISYLATE 10 MG/2ML IJ SOLN
10.0000 mg | Freq: Once | INTRAMUSCULAR | Status: AC
  Administered 2023-02-05: 10 mg via INTRAVENOUS
  Filled 2023-02-05: qty 2

## 2023-02-05 MED ORDER — MORPHINE SULFATE (PF) 4 MG/ML IV SOLN
4.0000 mg | Freq: Once | INTRAVENOUS | Status: AC
  Administered 2023-02-05: 4 mg via INTRAVENOUS
  Filled 2023-02-05: qty 1
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
## 2023-02-05 NOTE — Discharge Instructions (Addendum)
Use the drops as prescribed.  Follow up with the eye doc.   Take 4 over the counter ibuprofen tablets 3 times a day or 2 over-the-counter naproxen tablets twice a day for pain. Also take tylenol 1000mg (2 extra strength) four times a day.   Then take the pain medicine if you feel like you need it. Narcotics do not help with the pain, they only make you care about it less.  You can become addicted to this, people may break into your house to steal it.  It will constipate you.  If you drive under the influence of this medicine you can get a DUI.      Then take the pain medicine if you feel like you need it. Narcotics do not help with the pain, they only make you care about it less.  You can become addicted to this, people may break into your house to steal it.  It will constipate you.  If you drive under the influence of this medicine you can get a DUI.

## 2023-03-13 ENCOUNTER — Emergency Department (HOSPITAL_COMMUNITY): Payer: Medicaid Other

## 2023-03-13 ENCOUNTER — Other Ambulatory Visit: Payer: Self-pay

## 2023-03-13 ENCOUNTER — Encounter (HOSPITAL_COMMUNITY): Payer: Self-pay | Admitting: *Deleted

## 2023-03-13 ENCOUNTER — Emergency Department (HOSPITAL_COMMUNITY)
Admission: EM | Admit: 2023-03-13 | Discharge: 2023-03-13 | Discharge status: Against Medical Advice | Payer: Medicaid Other | Attending: Emergency Medicine | Admitting: Emergency Medicine

## 2023-03-13 DIAGNOSIS — Z5321 Procedure and treatment not carried out due to patient leaving prior to being seen by health care provider: Secondary | ICD-10-CM | POA: Insufficient documentation

## 2023-03-13 DIAGNOSIS — R079 Chest pain, unspecified: Secondary | ICD-10-CM | POA: Insufficient documentation

## 2023-03-13 DIAGNOSIS — R55 Syncope and collapse: Secondary | ICD-10-CM | POA: Insufficient documentation

## 2023-03-13 DIAGNOSIS — R519 Headache, unspecified: Secondary | ICD-10-CM | POA: Diagnosis not present

## 2023-03-13 LAB — BASIC METABOLIC PANEL
Anion gap: 10 (ref 5–15)
BUN: 16 mg/dL (ref 6–20)
CO2: 22 mmol/L (ref 22–32)
Calcium: 9.2 mg/dL (ref 8.9–10.3)
Chloride: 105 mmol/L (ref 98–111)
Creatinine, Ser: 0.8 mg/dL (ref 0.61–1.24)
GFR, Estimated: >60 mL/min (ref >60)
Glucose, Bld: 129 mg/dL — ABNORMAL HIGH (ref 70–99)
Potassium: 3.6 mmol/L (ref 3.5–5.1)
Sodium: 137 mmol/L (ref 135–145)

## 2023-03-13 LAB — CBC
HCT: 48.8 % (ref 39.0–52.0)
Hemoglobin: 16.6 g/dL (ref 13.0–17.0)
MCH: 29.6 pg (ref 26.0–34.0)
MCHC: 34 g/dL (ref 30.0–36.0)
MCV: 87.1 fL (ref 80.0–100.0)
Platelets: 268 10*3/uL (ref 150–400)
RBC: 5.6 MIL/uL (ref 4.22–5.81)
RDW: 12.2 % (ref 11.5–15.5)
WBC: 8.1 10*3/uL (ref 4.0–10.5)
nRBC: 0 % (ref 0.0–0.2)

## 2023-03-13 LAB — URINALYSIS, ROUTINE W REFLEX MICROSCOPIC
Bilirubin Urine: NEGATIVE
Glucose, UA: NEGATIVE mg/dL
Hgb urine dipstick: NEGATIVE
Ketones, ur: NEGATIVE mg/dL
Leukocytes,Ua: NEGATIVE
Nitrite: NEGATIVE
Protein, ur: NEGATIVE mg/dL
Specific Gravity, Urine: 1.01 (ref 1.005–1.030)
pH: 7 (ref 5.0–8.0)

## 2023-03-13 LAB — CBG MONITORING, ED: Glucose-Capillary: 148 mg/dL — ABNORMAL HIGH (ref 70–99)

## 2023-03-13 NOTE — ED Notes (Signed)
Pt called three times, no answer. Pt not located outside of ED either. Believe pt to have eloped.
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
## 2023-07-10 ENCOUNTER — Telehealth: Payer: Medicaid Other | Admitting: Nurse Practitioner

## 2023-07-10 DIAGNOSIS — L089 Local infection of the skin and subcutaneous tissue, unspecified: Secondary | ICD-10-CM

## 2023-07-10 MED ORDER — DOXYCYCLINE HYCLATE 100 MG PO TABS: 100.0000 mg | ORAL_TABLET | Freq: Two times a day (BID) | ORAL | 0 refills | Status: AC
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
## 2023-07-11 ENCOUNTER — Telehealth: Payer: Self-pay

## 2023-07-11 ENCOUNTER — Ambulatory Visit: Payer: Medicaid Other

## 2023-07-11 NOTE — Telephone Encounter (Signed)
Contacted patient about appointment. Patient stated appt not needed "I had a video visit yesterday". Also set up patient with PCP.  B. Roten CMA

## 2023-08-01 ENCOUNTER — Encounter: Payer: Self-pay | Admitting: Family Medicine

## 2023-08-01 ENCOUNTER — Ambulatory Visit (INDEPENDENT_AMBULATORY_CARE_PROVIDER_SITE_OTHER): Payer: Medicaid Other | Admitting: Family Medicine

## 2023-08-01 VITALS — BP 122/70 | HR 89 | Temp 98.7°F | Ht 72.5 in | Wt 280.8 lb

## 2023-08-01 DIAGNOSIS — Z131 Encounter for screening for diabetes mellitus: Secondary | ICD-10-CM | POA: Diagnosis not present

## 2023-08-01 DIAGNOSIS — F411 Generalized anxiety disorder: Secondary | ICD-10-CM | POA: Diagnosis not present

## 2023-08-01 DIAGNOSIS — Z720 Tobacco use: Secondary | ICD-10-CM

## 2023-08-01 DIAGNOSIS — L304 Erythema intertrigo: Secondary | ICD-10-CM

## 2023-08-01 DIAGNOSIS — R7303 Prediabetes: Secondary | ICD-10-CM | POA: Insufficient documentation

## 2023-08-01 DIAGNOSIS — F410 Panic disorder [episodic paroxysmal anxiety] without agoraphobia: Secondary | ICD-10-CM

## 2023-08-01 DIAGNOSIS — R42 Dizziness and giddiness: Secondary | ICD-10-CM | POA: Diagnosis not present

## 2023-08-01 DIAGNOSIS — Z13220 Encounter for screening for lipoid disorders: Secondary | ICD-10-CM | POA: Diagnosis not present

## 2023-08-01 DIAGNOSIS — S069XAS Unspecified intracranial injury with loss of consciousness status unknown, sequela: Secondary | ICD-10-CM | POA: Diagnosis not present

## 2023-08-01 DIAGNOSIS — Z9189 Other specified personal risk factors, not elsewhere classified: Secondary | ICD-10-CM

## 2023-08-01 DIAGNOSIS — G43019 Migraine without aura, intractable, without status migrainosus: Secondary | ICD-10-CM | POA: Diagnosis not present

## 2023-08-01 LAB — LIPID PANEL
Cholesterol: 168 mg/dL (ref 0–200)
HDL: 35.6 mg/dL — ABNORMAL LOW (ref >39.00)
LDL Cholesterol: 90 mg/dL (ref 0–99)
NonHDL: 132.41
Total CHOL/HDL Ratio: 5
Triglycerides: 210 mg/dL — ABNORMAL HIGH (ref 0.0–149.0)
VLDL: 42 mg/dL — ABNORMAL HIGH (ref 0.0–40.0)

## 2023-08-01 LAB — BASIC METABOLIC PANEL
BUN: 16 mg/dL (ref 6–23)
CO2: 26 meq/L (ref 19–32)
Calcium: 9.3 mg/dL (ref 8.4–10.5)
Chloride: 104 meq/L (ref 96–112)
Creatinine, Ser: 0.69 mg/dL (ref 0.40–1.50)
GFR: 129.56 mL/min (ref >60.00)
Glucose, Bld: 104 mg/dL — ABNORMAL HIGH (ref 70–99)
Potassium: 4 meq/L (ref 3.5–5.1)
Sodium: 138 meq/L (ref 135–145)

## 2023-08-01 LAB — HEMOGLOBIN A1C: Hgb A1c MFr Bld: 6 % (ref 4.6–6.5)

## 2023-08-01 MED ORDER — CLONAZEPAM 0.5 MG PO TABS
0.5000 mg | ORAL_TABLET | Freq: Two times a day (BID) | ORAL | 1 refills | Status: DC | PRN
Start: 2023-08-01 — End: 2023-10-02
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
## 2023-08-01 NOTE — Assessment & Plan Note (Signed)
By history this sounds more like dysequilibrium. It is unclear how this may relate to his anxiety vs. migraines vs. prior TBI. I will refer to neurology to assist in determining a better management plan.

## 2023-08-01 NOTE — Assessment & Plan Note (Signed)
Has failed multiple treatments in the past. Joshua [name] does counsel with a friend who is apparently a Nurse, adult. I will plan to continue clonazepam as needed for panic attacks.

## 2023-08-01 NOTE — Assessment & Plan Note (Signed)
Neurology referral related to associated headaches and dysequilibrium.

## 2023-08-01 NOTE — Assessment & Plan Note (Signed)
I recommend smoking cessation. He is not ready to quit at present.

## 2023-08-01 NOTE — Assessment & Plan Note (Signed)
The patient's record indicates migraine headaches. However, it is not clear if these may be post-traumatic headaches. They are occurring daily. I am concerned about some rebound related to his frequent use of Goody and BC powders. He had a head CT in April which did not show any concerning brain findings. I will refer him to neurology for an assessment.

## 2023-08-08 ENCOUNTER — Encounter: Payer: Self-pay | Admitting: Diagnostic Neuroimaging

## 2023-08-08 ENCOUNTER — Ambulatory Visit (INDEPENDENT_AMBULATORY_CARE_PROVIDER_SITE_OTHER): Payer: Medicaid Other | Admitting: Diagnostic Neuroimaging

## 2023-08-08 VITALS — BP 121/79 | HR 95 | Ht 72.0 in | Wt 278.6 lb

## 2023-08-08 DIAGNOSIS — G4719 Other hypersomnia: Secondary | ICD-10-CM

## 2023-08-08 DIAGNOSIS — G43709 Chronic migraine without aura, not intractable, without status migrainosus: Secondary | ICD-10-CM | POA: Diagnosis not present

## 2023-08-08 DIAGNOSIS — R0683 Snoring: Secondary | ICD-10-CM

## 2023-08-08 DIAGNOSIS — R0681 Apnea, not elsewhere classified: Secondary | ICD-10-CM

## 2023-08-08 DIAGNOSIS — R42 Dizziness and giddiness: Secondary | ICD-10-CM

## 2023-08-08 DIAGNOSIS — R200 Anesthesia of skin: Secondary | ICD-10-CM

## 2023-08-08 MED ORDER — RIZATRIPTAN BENZOATE 10 MG PO TBDP: 10.0000 mg | ORAL_TABLET | ORAL | 11 refills | Status: DC | PRN

## 2023-08-08 MED ORDER — TOPIRAMATE 50 MG PO TABS
50.0000 mg | ORAL_TABLET | Freq: Two times a day (BID) | ORAL | 12 refills | Status: DC
Start: 2023-08-08 — End: 2023-11-14

## 2023-08-08 NOTE — Patient Instructions (Signed)
DIZZINESS / LIGHTHEADEDNESS - check MRI brain (rule out stroke, demyelinating dz)   FATIGUE, SNORING, APNEA - check sleep study   MIGRAINE WITH AURA   MIGRAINE TREATMENT PLAN:  MIGRAINE PREVENTION  LIFESTYLE CHANGES -Stop or avoid smoking -Decrease or avoid caffeine / alcohol -Eat and sleep on a regular schedule -Exercise several times per week - start topiramate 50mg  at bedtime; after 1-2 weeks increase to 50mg  twice a day; drink plenty of water   MIGRAINE RESCUE  - maximum 5-10 doses of rescue medicine per month - ibuprofen, tylenol as needed  TRIPTANS - rizatriptan (Maxalt) 10mg  as needed for breakthrough headache; may repeat x 1 after 2 hours; max 2 tabs per day or 8 per month
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
## 2023-08-17 ENCOUNTER — Telehealth: Payer: Medicaid Other | Admitting: Physician Assistant

## 2023-08-17 DIAGNOSIS — A084 Viral intestinal infection, unspecified: Secondary | ICD-10-CM

## 2023-08-17 MED ORDER — SUCRALFATE 1 G PO TABS
1.0000 g | ORAL_TABLET | Freq: Three times a day (TID) | ORAL | 0 refills | Status: DC
Start: 2023-08-17 — End: 2023-10-30

## 2023-08-17 MED ORDER — DICYCLOMINE HCL 10 MG PO CAPS: 10.0000 mg | ORAL_CAPSULE | Freq: Three times a day (TID) | ORAL | 0 refills | Status: DC

## 2023-08-17 MED ORDER — LOPERAMIDE HCL 2 MG PO TABS
2.0000 mg | ORAL_TABLET | Freq: Four times a day (QID) | ORAL | 0 refills | Status: DC | PRN
Start: 2023-08-17 — End: 2023-11-14

## 2023-08-17 NOTE — Progress Notes (Signed)
Virtual Visit Consent   Joshua [name], you are scheduled for a virtual visit with a [facility] provider today. Just as with appointments in the office, your consent must be obtained to participate. Your consent will be active for this visit and any virtual visit you may have with one of our providers in the next 365 days. If you have a MyChart account, a copy of this consent can be sent to you electronically.  As this is a virtual visit, video technology does not allow for your provider to perform a traditional examination. This may limit your provider's ability to fully assess your condition. If your provider identifies any concerns that need to be evaluated in person or the need to arrange testing (such as labs, EKG, etc.), we will make arrangements to do so. Although advances in technology are sophisticated, we cannot ensure that it will always work on either your end or our end. If the connection with a video visit is poor, the visit may have to be switched to a telephone visit. With either a video or telephone visit, we are not always able to ensure that we have a secure connection.  By engaging in this virtual visit, you consent to the provision of healthcare and authorize for your insurance to be billed (if applicable) for the services provided during this visit. Depending on your insurance coverage, you may receive a charge related to this service.  I need to obtain your verbal consent now. Are you willing to proceed with your visit today? Joshua [name] has provided verbal consent on 08/17/2023 for a virtual visit (video or telephone). Joshua Loveless, PA-C  Date: 08/17/2023 11:53 AM  Virtual Visit via Video Note   I, Joshua [name], connected with  Joshua [name]  ([id number], [birth year]) on 08/17/23 at 11:45 AM EDT by a video-enabled telemedicine application and verified that I am speaking with the correct person using two identifiers.  Location: Patient: Virtual Visit Location  Patient: Home Provider: Virtual Visit Location Provider: Home Office   I discussed the limitations of evaluation and management by telemedicine and the availability of in person appointments. The patient expressed understanding and agreed to proceed.    History of Present Illness: Joshua [name] is a 22 y.o. who identifies as a male who was assigned male at birth, and is being seen today for diarrhea.  HPI: Diarrhea  This is a new problem. The current episode started in the past 7 days (3 days). The problem occurs more than 10 times per day. The problem has been unchanged. The stool consistency is described as Blood tinged. The patient states that diarrhea awakens him from sleep. Associated symptoms include abdominal pain, bloating and increased flatus. Pertinent negatives include no chills, fever, headaches, myalgias, sweats or vomiting. Associated symptoms comments: Stomach burning. Nothing aggravates the symptoms. There are no known risk factors. He has tried bismuth subsalicylate and increased fluids (took some dayquil for nasal congestion) for the symptoms. The treatment provided no relief. There is no history of inflammatory bowel disease, irritable bowel syndrome or a recent abdominal surgery.     Problems:  Patient Active Problem List   Diagnosis Date Noted   Generalized anxiety disorder with panic attacks 08/01/2023   Tobacco use 08/01/2023   Prediabetes 08/01/2023   Common migraine with intractable migraine 05/30/2017   Dizziness and giddiness 05/30/2017   Subdural hematoma (HCC)    TBI (traumatic brain injury) (HCC) 06/14/2016     Allergies:  Allergies  Allergen Reactions  Penicillin G     Other Reaction(s): Other (See Comments)  Does not remember   Penicillins Hives   Zofran [Ondansetron Hcl] Other (See Comments)    dizziness    Medications:  Current Outpatient Medications:    dicyclomine (BENTYL) 10 MG capsule, Take 1 capsule (10 mg total) by mouth 4 (four) times  daily -  before meals and at bedtime., Disp: 40 capsule, Rfl: 0   loperamide (IMODIUM A-D) 2 MG tablet, Take 1 tablet (2 mg total) by mouth 4 (four) times daily as needed for diarrhea or loose stools., Disp: 30 tablet, Rfl: 0   sucralfate (CARAFATE) 1 g tablet, Take 1 tablet (1 g total) by mouth 4 (four) times daily -  with meals and at bedtime., Disp: 40 tablet, Rfl: 0   clonazePAM (KLONOPIN) 0.5 MG tablet, Take 0.5 mg by mouth 2 (two) times daily., Disp: , Rfl:    clonazePAM (KLONOPIN) 0.5 MG tablet, Take 1 tablet (0.5 mg total) by mouth 2 (two) times daily as needed for anxiety., Disp: 30 tablet, Rfl: 1   ondansetron (ZOFRAN-ODT) 4 MG disintegrating tablet, Take 4 mg by mouth every 8 (eight) hours as needed., Disp: , Rfl:    rizatriptan (MAXALT-MLT) 10 MG disintegrating tablet, Take 1 tablet (10 mg total) by mouth as needed for migraine. May repeat in 2 hours if needed, Disp: 9 tablet, Rfl: 11   topiramate (TOPAMAX) 50 MG tablet, Take 1 tablet (50 mg total) by mouth 2 (two) times daily., Disp: 60 tablet, Rfl: 12  Observations/Objective: Patient is well-developed, well-nourished in no acute distress.  Resting comfortably at home.  Head is normocephalic, atraumatic.  No labored breathing.  Speech is clear and coherent with logical content.  Patient is alert and oriented at baseline.    Assessment and Plan: 1. Viral gastroenteritis - sucralfate (CARAFATE) 1 g tablet; Take 1 tablet (1 g total) by mouth 4 (four) times daily -  with meals and at bedtime.  Dispense: 40 tablet; Refill: 0 - dicyclomine (BENTYL) 10 MG capsule; Take 1 capsule (10 mg total) by mouth 4 (four) times daily -  before meals and at bedtime.  Dispense: 40 capsule; Refill: 0 - loperamide (IMODIUM A-D) 2 MG tablet; Take 1 tablet (2 mg total) by mouth 4 (four) times daily as needed for diarrhea or loose stools.  Dispense: 30 tablet; Refill: 0  - Suspect viral gastroenteritis, potentially Norovirus - Sucralfate for gastritis -  Dicyclomine for cramping - Imodium for diarrhea - Push fluids, electrolyte beverages - Liquid diet, then increase to soft/bland (BRAT) diet over next day, then increase diet as tolerated - Seek in person evaluation if not improving or symptoms worsen   Follow Up Instructions: I discussed the assessment and treatment plan with the patient. The patient was provided an opportunity to ask questions and all were answered. The patient agreed with the plan and demonstrated an understanding of the instructions.  A copy of instructions were sent to the patient via MyChart unless otherwise noted below.   The patient was advised to call back or seek an in-person evaluation if the symptoms worsen or if the condition fails to improve as anticipated.    Joshua Loveless, PA-C

## 2023-08-17 NOTE — Patient Instructions (Signed)
[name], thank you for joining Margaretann Loveless, PA-C for today's virtual visit.  While this provider is not your primary care provider (PCP), if your PCP is located in our provider database this encounter information will be shared with them immediately following your visit.   A [facility] MyChart account gives you access to today's visit and all your visits, tests, and labs performed at [facility] " click here if you don't have a [facility] MyChart account or go to [email or web address]  Consent: (Patient) Joshua [name] provided verbal consent for this virtual visit at the beginning of the encounter.  Current Medications:  Current Outpatient Medications:    dicyclomine (BENTYL) 10 MG capsule, Take 1 capsule (10 mg total) by mouth 4 (four) times daily -  before meals and at bedtime., Disp: 40 capsule, Rfl: 0   loperamide (IMODIUM A-D) 2 MG tablet, Take 1 tablet (2 mg total) by mouth 4 (four) times daily as needed for diarrhea or loose stools., Disp: 30 tablet, Rfl: 0   sucralfate (CARAFATE) 1 g tablet, Take 1 tablet (1 g total) by mouth 4 (four) times daily -  with meals and at bedtime., Disp: 40 tablet, Rfl: 0   clonazePAM (KLONOPIN) 0.5 MG tablet, Take 0.5 mg by mouth 2 (two) times daily., Disp: , Rfl:    clonazePAM (KLONOPIN) 0.5 MG tablet, Take 1 tablet (0.5 mg total) by mouth 2 (two) times daily as needed for anxiety., Disp: 30 tablet, Rfl: 1   ondansetron (ZOFRAN-ODT) 4 MG disintegrating tablet, Take 4 mg by mouth every 8 (eight) hours as needed., Disp: , Rfl:    rizatriptan (MAXALT-MLT) 10 MG disintegrating tablet, Take 1 tablet (10 mg total) by mouth as needed for migraine. May repeat in 2 hours if needed, Disp: 9 tablet, Rfl: 11   topiramate (TOPAMAX) 50 MG tablet, Take 1 tablet (50 mg total) by mouth 2 (two) times daily., Disp: 60 tablet, Rfl: 12   Medications ordered in this encounter:  Meds ordered this encounter  Medications   sucralfate (CARAFATE) 1 g  tablet    Sig: Take 1 tablet (1 g total) by mouth 4 (four) times daily -  with meals and at bedtime.    Dispense:  40 tablet    Refill:  0    Order Specific Question:   Supervising Provider    Answer:   Merrilee Jansky [[id number]]   dicyclomine (BENTYL) 10 MG capsule    Sig: Take 1 capsule (10 mg total) by mouth 4 (four) times daily -  before meals and at bedtime.    Dispense:  40 capsule    Refill:  0    Order Specific Question:   Supervising Provider    Answer:   Merrilee Jansky [id number]   loperamide (IMODIUM A-D) 2 MG tablet    Sig: Take 1 tablet (2 mg total) by mouth 4 (four) times daily as needed for diarrhea or loose stools.    Dispense:  30 tablet    Refill:  0    Order Specific Question:   Supervising Provider    Answer:   Merrilee Jansky [id number]     *If you need refills on other medications prior to your next appointment, please contact your pharmacy*  Follow-Up: Call back or seek an in-person evaluation if the symptoms worsen or if the condition fails to improve as anticipated.  [facility] [phone]  Other Instructions  Norovirus Infection Norovirus infection causes inflammation in the stomach  and intestines (gastroenteritis) and food poisoning. It is caused by exposure to a virus from a group of similar viruses called noroviruses. Norovirus spreads very easily from person to person (is very contagious). It often occurs in places where people are in close contact, such as schools, nursing homes, restaurants, and cruise ships. You can get it from food, water, surfaces, or other people who have the virus. Norovirus is also found in the stool (feces) or vomit of infected people. You can spread the infection as soon as you feel sick, and you may continue to be contagious after you recover. What are the causes? This condition is caused by contact with norovirus. You can catch norovirus if you: Eat or drink something that is contaminated with  norovirus. Touch surfaces or objects that are contaminated with norovirus and then put your hand in or by your mouth or nose. Have direct contact with an infected person who may or may not still have symptoms. Share food, drink, or utensils with someone who is contagious with norovirus. What are the signs or symptoms? Symptoms usually begin within 12 hours to 2 days after you become infected. Most norovirus symptoms affect the digestive system.Symptoms may include: Nausea, vomiting, and diarrhea. Stomach cramps. Fever. Chills. Headache. Muscle aches and tiredness. How is this diagnosed? This condition may be diagnosed based on: Your symptoms. A physical exam. A stool test. How is this treated? There is no specific treatment for norovirus. Most people get better without treatment in about 2 days. Young children, the elderly, and people who are already sick may take up to 6 days to recover. Follow these instructions at home:  Eating and drinking  Drink plenty of water to replace fluids that are lost through diarrhea and vomiting. This prevents dehydration. Drink enough fluid to keep your urine pale yellow. Drink clear fluids in small amounts as you are able. Clear fluids include water, ice chips, fruit juice with water added (diluted fruit juice), and low-calorie sports drinks. Avoid fluids that contain a lot of sugar or caffeine, such as energy drinks, sports drinks, and soda. Avoid alcohol. If instructed by your health care provider, drink an oral rehydration solution (ORS). This is a drink that is sold at pharmacies and retail stores. An ORS contains minerals (electrolytes) that you can lose through diarrhea and vomiting. Eat bland, easy-to-digest foods in small amounts as you are able. These foods include rice, lean meats, toast, and crackers. Avoid spicy or fatty foods. General instructions Rest at home while you recover. Do not prepare food for others while you are infected. Wait  at least 3 days after you recover from the illness to do this. Take over-the-counter and prescription medicines only as told by your health care provider. Wash your hands frequently with soap and water for at least 20 seconds. Alcohol-based hand sanitizer can be used in addition to soap and water, but sanitizer should not be the only cleansing method because it is not effective at removing norovirus from your hands or surfaces. Make sure that each person in your household washes his or her hands well and often. Keep all follow-up visits. This is important. How is this prevented? To help prevent the spread of norovirus: Stay at home if you are feeling sick. This will reduce the risk of spreading the virus to others. Wash your hands often with soap and water for at least 20 seconds, especially after using the toilet, helping a child use the toilet, or changing a child's diaper. Wash  fruits and vegetables thoroughly before peeling, preparing, or serving them. Throw out any food that a sick person may have touched. Disinfect contaminated surfaces immediately after someone in the household has been sick. Disinfect frequently used surfaces, such as counters, doorknobs, and faucets. Use a bleach-based household cleaner. Immediately remove and wash soiled clothes or sheets. Contact a health care provider if: You have vomiting, diarrhea, or stomach pain that gets worse. You have symptoms that do not go away after 3-6 days. You have a fever. You cannot drink without vomiting. You feel light-headed or dizzy. Your symptoms get worse. Get help right away if: You develop symptoms of dehydration that do not improve with fluid replacement, such as: Excessive sleepiness. Lack of tears. Very little urine production. Dry mouth. Muscle cramps. Weak pulse. Confusion. Summary Norovirus infection is common and often occurs in places where people are in close contact, such as schools, nursing homes,  restaurants, and cruise ships. To help prevent the spread of this infection, wash hands with soap and water for at least 20 seconds before handling food or after having contact with stool or body fluids. There is no specific treatment for norovirus, but most people get better without treatment in about 2 days. People who are healthy when infected often recover sooner than those who are elderly, young, or already sick. Replace lost fluids by drinking plenty of water, or by drinking oral rehydration solution (ORS), which contains important minerals called electrolytes. This prevents dehydration. This information is not intended to replace advice given to you by your health care provider. Make sure you discuss any questions you have with your health care provider. Document Revised: 06/09/2021 Document Reviewed: 06/09/2021 Elsevier Patient Education  2024 Elsevier Inc.   Food Choices to Help Relieve Diarrhea, Adult Diarrhea can make you feel weak and cause you to become dehydrated. Dehydration is a condition in which there is not enough water or other fluids in the body. It is important to choose the right foods and drinks to: Relieve diarrhea. Replace lost fluids and nutrients. Prevent dehydration. What are tips for following this plan? Relieving diarrhea Avoid foods that make your diarrhea worse. These may include: Foods and drinks that are sweetened with high-fructose corn syrup, honey, or sweeteners such as xylitol, sorbitol, and mannitol. Check food labels for these ingredients. Fried, greasy, or spicy foods. Raw fruits and vegetables. Eat foods that are rich in probiotics. These include foods such as yogurt and fermented milk products. Probiotics can help increase healthy bacteria in your stomach and intestines (gastrointestinal or GI tract). This may help digestion and stop diarrhea. If you have lactose intolerance, avoid dairy products. These may make your diarrhea worse. Take medicine to  help stop diarrhea only as told by your health care provider. Replacing nutrients  Eat bland, easy-to-digest foods in small amounts as you are able, until your diarrhea starts to get better. These foods include bananas, applesauce, rice, toast, and crackers. Over time, add nutrient-rich foods as your body tolerates them or as told by your health care provider. These include: Well-cooked protein foods, such as eggs, lean meats like fish or chicken without skin, and tofu. Peeled, seeded, and soft-cooked fruits and vegetables. Low-fat dairy products. Whole grains. Take vitamin and mineral supplements as told by your health care provider. Preventing dehydration  Start by sipping water or a solution to prevent dehydration (oral rehydration solution, or ORS). This is a drink that helps replace fluids and minerals your body has lost. You can buy an  ORS at pharmacies and retail stores. Try to drink at least 8-10 cups (2,000-2,500 mL) of fluid each day to help replace lost fluids. If your urine is pale yellow, you are getting enough fluids. You may drink other liquids in addition to water, such as fruit juice that you have added water to (diluted fruit juice) or low-calorie sports drinks, as tolerated or as told by your health care provider. Avoid drinks with caffeine, such as coffee, tea, or soft drinks. Avoid alcohol. This information is not intended to replace advice given to you by your health care provider. Make sure you discuss any questions you have with your health care provider. Document Revised: 04/19/2022 Document Reviewed: 04/19/2022 Elsevier Patient Education  2024 Elsevier Inc.    If you have been instructed to have an in-person evaluation today at a local Urgent Care facility, please use the link below. It will take you to a list of all of our available [facility], including address, phone number and hours of operation. Please do not delay care.  [facility]  If you or a family member do not have a primary care provider, use the link below to schedule a visit and establish care. When you choose a [facility] primary care physician or advanced practice provider, you gain a long-term partner in health. Find a Primary Care Provider  Learn more about [facility]'s in-office and virtual care options: [facility] - Get Care Now

## 2023-08-26 ENCOUNTER — Ambulatory Visit
Admission: EM | Admit: 2023-08-26 | Discharge: 2023-08-26 | Disposition: A | Discharge status: Home / Self Care | Payer: Medicaid Other | Attending: Physician Assistant | Admitting: Physician Assistant

## 2023-08-26 ENCOUNTER — Emergency Department (HOSPITAL_COMMUNITY)
Admission: EM | Admit: 2023-08-26 | Discharge: 2023-08-26 | Disposition: A | Discharge status: Home / Self Care | Payer: Medicaid Other | Attending: Emergency Medicine | Admitting: Emergency Medicine

## 2023-08-26 ENCOUNTER — Emergency Department (HOSPITAL_COMMUNITY): Payer: Medicaid Other

## 2023-08-26 DIAGNOSIS — J45909 Unspecified asthma, uncomplicated: Secondary | ICD-10-CM | POA: Insufficient documentation

## 2023-08-26 DIAGNOSIS — F17200 Nicotine dependence, unspecified, uncomplicated: Secondary | ICD-10-CM | POA: Diagnosis not present

## 2023-08-26 DIAGNOSIS — R0789 Other chest pain: Secondary | ICD-10-CM | POA: Insufficient documentation

## 2023-08-26 DIAGNOSIS — R079 Chest pain, unspecified: Secondary | ICD-10-CM | POA: Diagnosis not present

## 2023-08-26 DIAGNOSIS — R000 Tachycardia, unspecified: Secondary | ICD-10-CM | POA: Diagnosis not present

## 2023-08-26 LAB — BASIC METABOLIC PANEL
Anion gap: 8 (ref 5–15)
BUN: 19 mg/dL (ref 6–20)
CO2: 20 mmol/L — ABNORMAL LOW (ref 22–32)
Calcium: 9 mg/dL (ref 8.9–10.3)
Chloride: 107 mmol/L (ref 98–111)
Creatinine, Ser: 0.86 mg/dL (ref 0.61–1.24)
GFR, Estimated: >60 mL/min (ref >60)
Glucose, Bld: 116 mg/dL — ABNORMAL HIGH (ref 70–99)
Potassium: 4 mmol/L (ref 3.5–5.1)
Sodium: 135 mmol/L (ref 135–145)

## 2023-08-26 LAB — CBC
HCT: 47 % (ref 39.0–52.0)
Hemoglobin: 16.2 g/dL (ref 13.0–17.0)
MCH: 29.4 pg (ref 26.0–34.0)
MCHC: 34.5 g/dL (ref 30.0–36.0)
MCV: 85.3 fL (ref 80.0–100.0)
Platelets: 311 10*3/uL (ref 150–400)
RBC: 5.51 MIL/uL (ref 4.22–5.81)
RDW: 11.9 % (ref 11.5–15.5)
WBC: 8.9 10*3/uL (ref 4.0–10.5)
nRBC: 0 % (ref 0.0–0.2)

## 2023-08-26 LAB — D-DIMER, QUANTITATIVE: D-Dimer, Quant: 0.46 ug{FEU}/mL (ref 0.00–0.50)

## 2023-08-26 LAB — TROPONIN I (HIGH SENSITIVITY): Troponin I (High Sensitivity): <2 ng/L (ref <18)

## 2023-08-26 MED ORDER — PANTOPRAZOLE SODIUM 20 MG PO TBEC: 20.0000 mg | DELAYED_RELEASE_TABLET | Freq: Every day | ORAL | 0 refills | Status: DC

## 2023-08-26 MED ORDER — ALUM & MAG HYDROXIDE-SIMETH 200-200-20 MG/5ML PO SUSP
30.0000 mL | Freq: Once | ORAL | Status: AC
  Administered 2023-08-26: 30 mL via ORAL
  Filled 2023-08-26: qty 30

## 2023-08-26 NOTE — ED Triage Notes (Signed)
Patient reports chest pain x 2 days, center chest, radiating toward back in between shoulder blades. Pain 8/10. Hurts when he takes a breath. Denies any injury .

## 2023-08-26 NOTE — ED Notes (Signed)
Patient is being discharged from the Urgent Care and sent to the Emergency Department via Private Vehicle (Family Member) . Per R. Reggie Pile, patient is in need of higher level of care due to Chest Pain, Further evaluations needed. Patient is aware and verbalizes understanding of plan of care.  Vitals:   08/26/23 1411  BP: 114/73  Pulse: (!) 106  Resp: 18  Temp: 98 F (36.7 C)  SpO2: 98%

## 2023-08-26 NOTE — ED Provider Notes (Signed)
Patient here today for evaluation of left-sided chest pain.  He states that his pain has progressed since that started yesterday.  He notes that he has had some dizziness but no shortness of breath.  Recommended further evaluation in the emergency room and patient is agreeable to same.  Wife will transport via POV.   Tomi Bamberger, PA-C 08/26/23 1434

## 2023-08-26 NOTE — ED Notes (Addendum)
GAD 7 Completed, Please review.

## 2023-08-26 NOTE — ED Provider Notes (Signed)
[place] EMERGENCY DEPARTMENT AT [facility] Provider Note   CSN: [id number] Arrival date & time: 08/26/23  1451     History  Chief Complaint  Patient presents with   Chest Pain    Joshua [name] is a 22 y.o. male.  The history is provided by the patient and medical records. No language interpreter was used.  Chest Pain    22 year old male significant history of asthma, anxiety, migraine, TBI presenting with complaints of chest pain.  Patient report for the past 2 days he has had pain to his chest.  He first noticed that well with sleeping.  He described pain as a sharp sensation to his mid chest radiates to his back worse with certain movement or when he takes a deep breath.  He also felt pain felt similar to prior heartburn and he took some Tums which did help with his symptoms.  Overall patient states he just does not feel like himself.  On occasion he would have ting sensation to his hands and feet.  He recently was started on Topamax for headache.  He stopped the medication a week ago and was unsure if this contributed to his symptoms.  Patient admits he does use tobacco products.  Denies any significant family history of cardiac disease.  No prior history of PE or DVT no recent surgery or prolonged bedrest.  Denies any leg swelling or calf pain.  Denies any abdominal pain dysuria no nausea vomiting diarrhea.  Pain is present at this time and is mild.  Home Medications Prior to Admission medications   Medication Sig Start Date End Date Taking? Authorizing Provider  pantoprazole (PROTONIX) 20 MG tablet Take 1 tablet (20 mg total) by mouth daily. 08/26/23  Yes Fayrene Helper, PA-C  clonazePAM (KLONOPIN) 0.5 MG tablet Take 0.5 mg by mouth 2 (two) times daily. 04/12/23   [provider]  clonazePAM (KLONOPIN) 0.5 MG tablet Take 1 tablet (0.5 mg total) by mouth 2 (two) times daily as needed for anxiety. 08/01/23   Loyola Mast, MD  dicyclomine (BENTYL) 10 MG capsule  Take 1 capsule (10 mg total) by mouth 4 (four) times daily -  before meals and at bedtime. 08/17/23   Margaretann Loveless, PA-C  loperamide (IMODIUM A-D) 2 MG tablet Take 1 tablet (2 mg total) by mouth 4 (four) times daily as needed for diarrhea or loose stools. 08/17/23   Margaretann Loveless, PA-C  ondansetron (ZOFRAN-ODT) 4 MG disintegrating tablet Take 4 mg by mouth every 8 (eight) hours as needed. 05/17/23   [provider]  rizatriptan (MAXALT-MLT) 10 MG disintegrating tablet Take 1 tablet (10 mg total) by mouth as needed for migraine. May repeat in 2 hours if needed 08/08/23   Penumalli, Glenford Bayley, MD  sucralfate (CARAFATE) 1 g tablet Take 1 tablet (1 g total) by mouth 4 (four) times daily -  with meals and at bedtime. 08/17/23   Margaretann Loveless, PA-C  topiramate (TOPAMAX) 50 MG tablet Take 1 tablet (50 mg total) by mouth 2 (two) times daily. 08/08/23   Penumalli, Glenford Bayley, MD      Allergies    Penicillin g, Penicillins, and Zofran [ondansetron hcl]    Review of Systems   Review of Systems  Cardiovascular:  Positive for chest pain.  All other systems reviewed and are negative.   Physical Exam Updated Vital Signs BP 116/73   Pulse 77   Temp 98.3 F (36.8 C) (Oral)   Resp 17  SpO2 96%  Physical Exam Vitals and nursing note reviewed.  Constitutional:      General: He is not in acute distress.    Appearance: He is well-developed.  HENT:     Head: Atraumatic.  Eyes:     Conjunctiva/sclera: Conjunctivae normal.  Cardiovascular:     Rate and Rhythm: Regular rhythm. Tachycardia present.     Pulses: Normal pulses.     Heart sounds: Normal heart sounds.  Pulmonary:     Effort: Pulmonary effort is normal.     Breath sounds: Normal breath sounds. No wheezing, rhonchi or rales.  Abdominal:     Palpations: Abdomen is soft.     Tenderness: There is no abdominal tenderness.  Musculoskeletal:     Cervical back: Neck supple.     Right lower leg: No edema.     Left lower  leg: No edema.  Skin:    Findings: No rash.  Neurological:     Mental Status: He is alert and oriented to person, place, and time.     ED Results / Procedures / Treatments   Labs (all labs ordered are listed, but only abnormal results are displayed) Labs Reviewed  BASIC METABOLIC PANEL - Abnormal; Notable for the following components:      Result Value   CO2 20 (*)    Glucose, Bld 116 (*)    All other components within normal limits  CBC  D-DIMER, QUANTITATIVE  TROPONIN I (HIGH SENSITIVITY)  TROPONIN I (HIGH SENSITIVITY)    EKG None  Date: 08/26/2023  Rate: 103  Rhythm: sinus tachycardia  QRS Axis: normal  Intervals: normal  ST/T Wave abnormalities: normal  Conduction Disutrbances: none  Narrative Interpretation:   Old EKG Reviewed: No significant changes noted    Radiology DG Chest 2 View  Result Date: 08/26/2023 CLINICAL DATA:  Central chest pain radiating to the back between the shoulder blades for the past 2 days. EXAM: CHEST - 2 VIEW COMPARISON:  Chest x-ray dated March 13, 2023. FINDINGS: The heart size and mediastinal contours are within normal limits. Both lungs are clear. The visualized skeletal structures are unremarkable. IMPRESSION: No active cardiopulmonary disease. Electronically Signed   By: Obie Dredge M.D.   On: 08/26/2023 16:04    Procedures Procedures    Medications Ordered in ED Medications  alum & mag hydroxide-simeth (MAALOX/MYLANTA) 200-200-20 MG/5ML suspension 30 mL (30 mLs Oral Given 08/26/23 1639)    ED Course/ Medical Decision Making/ A&P             HEART Score: 1                    Medical Decision Making Amount and/or Complexity of Data Reviewed Labs: ordered. Radiology: ordered.  Risk OTC drugs.   BP 116/73   Pulse 77   Temp 98.3 F (36.8 C) (Oral)   Resp 17   SpO2 96%   5:64 PM  22 year old male significant history of asthma, anxiety, migraine, TBI presenting with complaints of chest pain.  Patient report  for the past 2 days he has had pain to his chest.  He first noticed that well with sleeping.  He described pain as a sharp sensation to his mid chest radiates to his back worse with certain movement or when he takes a deep breath.  He also felt pain felt similar to prior heartburn and he took some Tums which did help with his symptoms.  Overall patient states he just does not feel like  himself.  On occasion he would have ting sensation to his hands and feet.  He recently was started on Topamax for headache.  He stopped the medication a week ago and was unsure if this contributed to his symptoms.  Patient admits he does use tobacco products.  Denies any significant family history of cardiac disease.  No prior history of PE or DVT no recent surgery or prolonged bedrest.  Denies any leg swelling or calf pain.  Denies any abdominal pain dysuria no nausea vomiting diarrhea.  Pain is present at this time and is mild.  On exam, patient is resting comfortably in bed appears to be in no acute discomfort.  Heart with tachycardia, lungs clear to auscultation bilaterally abdomen is soft nontender no chest wall tenderness, patient has intact pulses to all 4 extremities.  No thyromegaly  -Labs ordered, independently viewed and interpreted by me.  Labs remarkable for normal d-dimer.  Normal trop.  Labs are reassuring -The patient was maintained on a cardiac monitor.  I personally viewed and interpreted the cardiac monitored which showed an underlying rhythm of: sinus tachycardia -Imaging independently viewed and interpreted by me and I agree with radiologist's interpretation.  Result remarkable for CXR without acute changes -This patient presents to the ED for concern of chest pain, this involves an extensive number of treatment options, and is a complaint that carries with it a high risk of complications and morbidity.  The differential diagnosis includes GERD, gastritis, PNA, PE, MI, costochondritis -Co morbidities that  complicate the patient evaluation includes tobacco use -Treatment includes GI cocktail -Reevaluation of the patient after these medicines showed that the patient improved -PCP office notes or outside notes reviewed -Escalation to admission/observation considered: patients feels much better, is comfortable with discharge, and will follow up with PCP -Prescription medication considered, patient comfortable with protonix -Social Determinant of Health considered which includes tobacco use  Negative d-dimer.  Doubt PE         Final Clinical Impression(s) / ED Diagnoses Final diagnoses:  Nonspecific chest pain    Rx / DC Orders ED Discharge Orders          Ordered    pantoprazole (PROTONIX) 20 MG tablet  Daily        08/26/23 1743              Fayrene Helper, PA-C 08/26/23 1745    Charlynne Pander, MD 08/26/23 2205

## 2023-08-26 NOTE — ED Triage Notes (Signed)
"  I went to my PCP a few wks ago, I had some abnormal labs, a few nights ago woke up with real bad chest pain, this persisted and now this pain is more recurrent, sharp pains left upper chest, faint feeling, dizziness, no sob".  No recent sickness.

## 2023-08-31 ENCOUNTER — Encounter: Payer: Medicaid Other | Admitting: Family Medicine

## 2023-09-04 ENCOUNTER — Encounter: Payer: Medicaid Other | Admitting: Family Medicine

## 2023-09-04 ENCOUNTER — Ambulatory Visit: Payer: Medicaid Other | Admitting: Neurology

## 2023-09-04 ENCOUNTER — Telehealth: Payer: Self-pay | Admitting: Family Medicine

## 2023-09-04 ENCOUNTER — Encounter: Payer: Self-pay | Admitting: Neurology

## 2023-09-04 VITALS — BP 128/75 | HR 75 | Ht 72.0 in | Wt 273.4 lb

## 2023-09-04 DIAGNOSIS — R0681 Apnea, not elsewhere classified: Secondary | ICD-10-CM

## 2023-09-04 DIAGNOSIS — R0683 Snoring: Secondary | ICD-10-CM

## 2023-09-04 DIAGNOSIS — E669 Obesity, unspecified: Secondary | ICD-10-CM

## 2023-09-04 DIAGNOSIS — Z9189 Other specified personal risk factors, not elsewhere classified: Secondary | ICD-10-CM | POA: Diagnosis not present

## 2023-09-04 DIAGNOSIS — G4719 Other hypersomnia: Secondary | ICD-10-CM

## 2023-09-04 DIAGNOSIS — R519 Headache, unspecified: Secondary | ICD-10-CM

## 2023-09-04 DIAGNOSIS — Z820 Family history of epilepsy and other diseases of the nervous system: Secondary | ICD-10-CM | POA: Diagnosis not present

## 2023-09-04 NOTE — Progress Notes (Signed)
Subjective:    Patient ID: Joshua [name] is a 22 y.o. male.  HPI    Joshua Foley, MD, PhD [facility] [address] [address] [place], [place] [address]  Dear Dr. Veto Kemps,   I saw your patient, Joshua [name], upon your kind request in my sleep clinic today for initial consultation of his sleep disorder, in particular, concern for underlying obstructive sleep apnea.  The patient is accompanied by his wife today.  As you know, Joshua [name] is a 22 year old male with an underlying medical history of migraine headaches (followed by my colleague, Dr. Marjory Lies and previously by Dr. Anne Hahn), history of traumatic brain injury, history of subdural hematoma, anxiety, smoking, dizziness and obesity, who reports snoring and excessive daytime somnolence, as well as witnessed apneas per wife's report.  His Epworth sleepiness score is 15 out of 24, fatigue severity score is completely out of 63. I reviewed your office note from 08/01/2023.  I also reviewed Dr. Richrd Humbles office visit note from 08/08/2023.  He has had weight gain over the past couple years in the realm of 60 pounds.  Lately, in the past month or so he lost about 10 pounds.  His sister has sleep apnea and uses a PAP machine.  He has trouble falling asleep.  He typically tries to be in bed around 10 but may not be asleep until midnight or later.  He has not tried any medication for sleep, including no over-the-counter sleep aids.  He does have a prescription for clonazepam for anxiety.  His rise time is around 8 AM.  He denies nightly nocturia but has had occasional morning headaches.  He has a TV in the bedroom but it tends to not bother him, he does not care 1 way or another if the TV is on or not, sometimes his wife likes to watch something if they go to bed early.  They have a 71-year-old son who often ends up in her bed.  They have no pets in the household, they have outside animals.  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  He drinks no daily caffeine, alcohol rarely, he smokes half a pack per day and has cut back, in the process of smoking cessation eventually, used to smoke 2 packs/day at one point.  His Past Medical History Is Significant For: Past Medical History:  Diagnosis Date   Anxiety    Asthma    Closed head injury    Common migraine with intractable migraine 05/30/2017   Diarrhea    History of pelvic fracture    History of spinal fracture    Medical history non-contributory    Seasonal allergies    Vomiting     His Past Surgical History Is Significant For: Past Surgical History:  Procedure Laterality Date   NO PAST SURGERIES      His Family History Is Significant For: Family History  Problem Relation Age of Onset   Hypertension Mother    Hypertension Sister    Diabetes Maternal Grandmother    Hypertension Maternal Grandmother    Cancer Maternal Grandfather    Hirschsprung's disease Neg Hx     His Social History Is Significant For: Social History   Socioeconomic History   Marital status: Significant Other    Spouse name: Not on file   Number of children: 1   Years of education: Not on file   Highest education level: Not on  file  Occupational History   Occupation: Forensic psychologist    Comment: Self-employed  Tobacco Use   Smoking status: Every Day    Current packs/day: 0.50    Types: Cigarettes   Smokeless tobacco: Never  Vaping Use   Vaping status: Every Day   Substances: Nicotine, Flavoring  Substance and Sexual Activity   Alcohol use: Yes    Comment: Socially    Drug use: Not Currently    Types: Marijuana    Comment: Last time 2-3 years   Sexual activity: Not Currently  Other Topics Concern   Not on file  Social History Narrative   Not on file   Social Determinants of Health   Financial Resource Strain: Not on file  Food Insecurity: Not on file  Transportation Needs: Not on file   Physical Activity: Not on file  Stress: Not on file  Social Connections: Not on file    His Allergies Are:  Allergies  Allergen Reactions   Penicillin G     Other Reaction(s): Other (See Comments)  Does not remember   Penicillins Hives   Zofran [Ondansetron Hcl] Other (See Comments)    dizziness  :   His Current Medications Are:  Outpatient Encounter Medications as of 09/04/2023  Medication Sig   clonazePAM (KLONOPIN) 0.5 MG tablet Take 1 tablet (0.5 mg total) by mouth 2 (two) times daily as needed for anxiety.   clonazePAM (KLONOPIN) 0.5 MG tablet Take 0.5 mg by mouth 2 (two) times daily.   dicyclomine (BENTYL) 10 MG capsule Take 1 capsule (10 mg total) by mouth 4 (four) times daily -  before meals and at bedtime. (Patient not taking: Reported on 09/04/2023)   loperamide (IMODIUM A-D) 2 MG tablet Take 1 tablet (2 mg total) by mouth 4 (four) times daily as needed for diarrhea or loose stools.   ondansetron (ZOFRAN-ODT) 4 MG disintegrating tablet Take 4 mg by mouth every 8 (eight) hours as needed. (Patient not taking: Reported on 09/04/2023)   pantoprazole (PROTONIX) 20 MG tablet Take 1 tablet (20 mg total) by mouth daily. (Patient not taking: Reported on 09/04/2023)   rizatriptan (MAXALT-MLT) 10 MG disintegrating tablet Take 1 tablet (10 mg total) by mouth as needed for migraine. May repeat in 2 hours if needed (Patient not taking: Reported on 09/04/2023)   sucralfate (CARAFATE) 1 g tablet Take 1 tablet (1 g total) by mouth 4 (four) times daily -  with meals and at bedtime. (Patient not taking: Reported on 09/04/2023)   topiramate (TOPAMAX) 50 MG tablet Take 1 tablet (50 mg total) by mouth 2 (two) times daily.   No facility-administered encounter medications on file as of 09/04/2023.  :   Review of Systems:  Out of a complete 14 point review of systems, all are reviewed and negative with the exception of these symptoms as listed below:  Review of Systems  Neurological:         Pt here for sleep consult  Pt snores,headaches,fatigue,hypertension Pt denies sleep study,CPAP machine   ESS FSS     Objective:  Neurological Exam  Physical Exam Physical Examination:   Vitals:   09/04/23 0844  BP: 128/75  Pulse: 75    General Examination: The patient is a very pleasant 22 y.o. male in no acute distress. He appears well-developed and well-nourished and well groomed.   HEENT: Normocephalic, atraumatic, pupils are equal, round and reactive to light, extraocular tracking is good without limitation to gaze excursion or nystagmus noted. Hearing is grossly  intact. Face is symmetric with normal facial animation. Speech is clear with no dysarthria noted. There is no hypophonia. There is no lip, neck/head, jaw or voice tremor. Neck is supple with full range of passive and active motion. There are no carotid bruits on auscultation. Oropharynx exam reveals: mild mouth dryness, adequate dental hygiene and mild airway crowding, due to small airway entry, tonsillar size of about 1+, Mallampati class II.  Neck circumference 19-1/4 inches, no overbite noted.  Tongue protrudes centrally and palate elevates symmetrically.  Chest: Clear to auscultation without wheezing, rhonchi or crackles noted.  Heart: S1+S2+0, regular and normal without murmurs, rubs or gallops noted.   Abdomen: Soft, non-tender and non-distended.  Extremities: There is no pitting edema in the distal lower extremities bilaterally.   Skin: Warm and dry without trophic changes noted.   Musculoskeletal: exam reveals no obvious joint deformities.   Neurologically:  Mental status: The patient is awake, alert and oriented in all 4 spheres. His immediate and remote memory, attention, language skills and fund of knowledge are appropriate. There is no evidence of aphasia, agnosia, apraxia or anomia. Speech is clear with normal prosody and enunciation. Thought process is linear. Mood is normal and affect is normal.   Cranial nerves II - XII are as described above under HEENT exam.  Motor exam: Normal bulk, strength and tone is noted. There is no obvious action or resting tremor.  Fine motor skills and coordination: grossly intact.  Cerebellar testing: No dysmetria or intention tremor. There is no truncal or gait ataxia.  Sensory exam: intact to light touch in the upper and lower extremities.  Gait, station and balance: He stands easily. No veering to one side is noted. No leaning to one side is noted. Posture is age-appropriate and stance is narrow based. Gait shows normal stride length and normal pace. No problems turning are noted.   Assessment and Plan:  In summary, Joshua [name] is a very pleasant 22 year old male with an underlying medical history of migraine headaches (followed by my colleague, Dr. Marjory Lies and previously by Dr. Anne Hahn), history of traumatic brain injury, history of subdural hematoma, anxiety, smoking, dizziness and obesity, whose history and physical exam are concerning for sleep disordered breathing, particularly obstructive sleep apnea (OSA).  While a laboratory attended sleep study is typically considered "gold standard" for evaluation of sleep disordered breathing, we mutually agreed to proceed with a home sleep test at this time.   I had a long chat with the patient and his significant other about my findings and the diagnosis of sleep apnea, particularly OSA, its prognosis and treatment options. We talked about medical/conservative treatments, surgical interventions and non-pharmacological approaches for symptom control. I explained, in particular, the risks and ramifications of untreated moderate to severe OSA, especially with respect to developing cardiovascular disease down the road, including congestive heart failure (CHF), difficult to treat hypertension, cardiac arrhythmias (particularly A-fib), neurovascular complications including TIA, stroke and dementia. Even type 2 diabetes  has, in part, been linked to untreated OSA. Symptoms of untreated OSA may include (but may not be limited to) daytime sleepiness, nocturia (i.e. frequent nighttime urination), memory problems, mood irritability and suboptimally controlled or worsening mood disorder such as depression and/or anxiety, lack of energy, lack of motivation, physical discomfort, as well as recurrent headaches, especially morning or nocturnal headaches. We talked about the importance of maintaining a healthy lifestyle and striving for healthy weight.  The importance of complete smoking cessation was also addressed.  In addition, we talked  about the importance of striving for and maintaining good sleep hygiene. I recommended a sleep study at this time. I outlined the differences between a laboratory attended sleep study which is considered more comprehensive and accurate over the option of a home sleep test (HST); the latter may lead to underestimation of sleep disordered breathing in some instances and does not help with diagnosing upper airway resistance syndrome and is not accurate enough to diagnose primary central sleep apnea typically. I outlined possible surgical and non-surgical treatment options of OSA, including the use of a positive airway pressure (PAP) device (i.e. CPAP, AutoPAP/APAP or BiPAP in certain circumstances), a custom-made dental device (aka oral appliance, which would require a referral to a specialist dentist or orthodontist typically, and is generally speaking not considered for patients with full dentures or edentulous state), upper airway surgical options, such as traditional UPPP (which is not considered a first-line treatment) or the Inspire device (hypoglossal nerve stimulator, which would involve a referral for consultation with an ENT surgeon, after careful selection, following inclusion criteria - also not first-line treatment). I explained the PAP treatment option to the patient in detail, as this is  generally considered first-line treatment.  The patient indicated that he would be willing to try PAP therapy, if the need arises. I explained the importance of being compliant with PAP treatment, not only for insurance purposes but primarily to improve patient's symptoms symptoms, and for the patient's long term health benefit, including to reduce His cardiovascular risks longer-term.    We will pick up our discussion about the next steps and treatment options after testing.  We will keep them posted as to the test results by phone call and/or MyChart messaging where possible.  We will plan to follow-up in sleep clinic accordingly as well.  I answered all their questions today and the patient and his significant other were in agreement.   I encouraged him to call with any interim questions, concerns, problems or updates or email [place] through MyChart.  Generally speaking, sleep test authorizations may take up to 2 weeks, sometimes less, sometimes longer, the patient is encouraged to get in touch with [place] if they do not hear back from the sleep lab staff directly within the next 2 weeks.  Thank you very much for allowing me to participate in the care of this nice patient. If I can be of any further assistance to you please do not hesitate to call me at [phone].  Sincerely,   Joshua Foley, MD, PhD

## 2023-09-04 NOTE — Patient Instructions (Signed)

## 2023-09-04 NOTE — Telephone Encounter (Signed)
NS no reason available letter printed

## 2023-09-05 NOTE — Telephone Encounter (Signed)
 1st no show, letter sent via mail

## 2023-09-11 ENCOUNTER — Telehealth: Payer: Self-pay | Admitting: Diagnostic Neuroimaging

## 2023-09-11 NOTE — Telephone Encounter (Signed)
[facility]: [id number] exp. 10/26/23 for GI

## 2023-09-12 ENCOUNTER — Telehealth: Payer: Self-pay | Admitting: Neurology

## 2023-09-12 NOTE — Telephone Encounter (Signed)
HST- [facility] no auth req   Patient is scheduled at [facility] for 10/04/23 at 9:30 AM.  Mailed packet to the patient.

## 2023-09-17 ENCOUNTER — Ambulatory Visit: Admission: RE | Admit: 2023-09-17 | Payer: Medicaid Other | Source: Ambulatory Visit

## 2023-10-02 ENCOUNTER — Telehealth: Payer: Medicaid Other | Admitting: Physician Assistant

## 2023-10-02 ENCOUNTER — Encounter: Payer: Self-pay | Admitting: Family Medicine

## 2023-10-02 DIAGNOSIS — J069 Acute upper respiratory infection, unspecified: Secondary | ICD-10-CM | POA: Diagnosis not present

## 2023-10-02 DIAGNOSIS — F410 Panic disorder [episodic paroxysmal anxiety] without agoraphobia: Secondary | ICD-10-CM

## 2023-10-02 DIAGNOSIS — B9689 Other specified bacterial agents as the cause of diseases classified elsewhere: Secondary | ICD-10-CM | POA: Diagnosis not present

## 2023-10-02 MED ORDER — CLONAZEPAM 0.5 MG PO TABS
0.5000 mg | ORAL_TABLET | Freq: Two times a day (BID) | ORAL | 0 refills | Status: DC | PRN
Start: 2023-10-02 — End: 2023-11-14

## 2023-10-02 MED ORDER — PREDNISONE 20 MG PO TABS
40.0000 mg | ORAL_TABLET | Freq: Every day | ORAL | 0 refills | Status: DC
Start: 2023-10-02 — End: 2023-11-14

## 2023-10-02 MED ORDER — BENZONATATE 100 MG PO CAPS
100.0000 mg | ORAL_CAPSULE | Freq: Three times a day (TID) | ORAL | 0 refills | Status: DC | PRN
Start: 2023-10-02 — End: 2023-11-14

## 2023-10-02 MED ORDER — PSEUDOEPH-BROMPHEN-DM 30-2-10 MG/5ML PO SYRP: 5.0000 mL | ORAL_SOLUTION | Freq: Four times a day (QID) | ORAL | 0 refills | Status: DC | PRN

## 2023-10-02 MED ORDER — DOXYCYCLINE HYCLATE 100 MG PO TABS
100.0000 mg | ORAL_TABLET | Freq: Two times a day (BID) | ORAL | 0 refills | Status: DC
Start: 2023-10-02 — End: 2023-11-14

## 2023-10-02 NOTE — Telephone Encounter (Signed)
Refill request for  Clonazepam 0.5 mg  LR 08/01/23, #30, 1 rf LOV  08/01/23 FOV none scheduled.    Patient also asking for a refill on  Meclizine.   Hx provider.     Please review and advise.  Thanks. Dm/cma

## 2023-10-02 NOTE — Patient Instructions (Signed)
Joshua [name], thank you for joining Margaretann Loveless, PA-C for today's virtual visit.  While this provider is not your primary care provider (PCP), if your PCP is located in our provider database this encounter information will be shared with them immediately following your visit.   A [facility] MyChart account gives you access to today's visit and all your visits, tests, and labs performed at [facility] " click here if you don't have a [facility] MyChart account or go to [email or web address]  Consent: (Patient) Joshua [name] provided verbal consent for this virtual visit at the beginning of the encounter.  Current Medications:  Current Outpatient Medications:    benzonatate (TESSALON) 100 MG capsule, Take 1-2 capsules (100-200 mg total) by mouth 3 (three) times daily as needed., Disp: 30 capsule, Rfl: 0   brompheniramine-pseudoephedrine-DM 30-2-10 MG/5ML syrup, Take 5 mLs by mouth 4 (four) times daily as needed., Disp: 120 mL, Rfl: 0   doxycycline (VIBRA-TABS) 100 MG tablet, Take 1 tablet (100 mg total) by mouth 2 (two) times daily., Disp: 20 tablet, Rfl: 0   predniSONE (DELTASONE) 20 MG tablet, Take 2 tablets (40 mg total) by mouth daily with breakfast., Disp: 10 tablet, Rfl: 0   clonazePAM (KLONOPIN) 0.5 MG tablet, Take 0.5 mg by mouth 2 (two) times daily., Disp: , Rfl:    clonazePAM (KLONOPIN) 0.5 MG tablet, Take 1 tablet (0.5 mg total) by mouth 2 (two) times daily as needed for anxiety., Disp: 30 tablet, Rfl: 1   dicyclomine (BENTYL) 10 MG capsule, Take 1 capsule (10 mg total) by mouth 4 (four) times daily -  before meals and at bedtime. (Patient not taking: Reported on 09/04/2023), Disp: 40 capsule, Rfl: 0   loperamide (IMODIUM A-D) 2 MG tablet, Take 1 tablet (2 mg total) by mouth 4 (four) times daily as needed for diarrhea or loose stools., Disp: 30 tablet, Rfl: 0   ondansetron (ZOFRAN-ODT) 4 MG disintegrating tablet, Take 4 mg by mouth every 8 (eight) hours as  needed. (Patient not taking: Reported on 09/04/2023), Disp: , Rfl:    pantoprazole (PROTONIX) 20 MG tablet, Take 1 tablet (20 mg total) by mouth daily. (Patient not taking: Reported on 09/04/2023), Disp: 30 tablet, Rfl: 0   rizatriptan (MAXALT-MLT) 10 MG disintegrating tablet, Take 1 tablet (10 mg total) by mouth as needed for migraine. May repeat in 2 hours if needed (Patient not taking: Reported on 09/04/2023), Disp: 9 tablet, Rfl: 11   sucralfate (CARAFATE) 1 g tablet, Take 1 tablet (1 g total) by mouth 4 (four) times daily -  with meals and at bedtime. (Patient not taking: Reported on 09/04/2023), Disp: 40 tablet, Rfl: 0   topiramate (TOPAMAX) 50 MG tablet, Take 1 tablet (50 mg total) by mouth 2 (two) times daily., Disp: 60 tablet, Rfl: 12   Medications ordered in this encounter:  Meds ordered this encounter  Medications   doxycycline (VIBRA-TABS) 100 MG tablet    Sig: Take 1 tablet (100 mg total) by mouth 2 (two) times daily.    Dispense:  20 tablet    Refill:  0    Order Specific Question:   Supervising Provider    Answer:   Merrilee Jansky [[id number]]   predniSONE (DELTASONE) 20 MG tablet    Sig: Take 2 tablets (40 mg total) by mouth daily with breakfast.    Dispense:  10 tablet    Refill:  0    Order Specific Question:   Supervising Provider    Answer:  LAMPTEY, PHILIP O [[id number]]   brompheniramine-pseudoephedrine-DM 30-2-10 MG/5ML syrup    Sig: Take 5 mLs by mouth 4 (four) times daily as needed.    Dispense:  120 mL    Refill:  0    Order Specific Question:   Supervising Provider    Answer:   Merrilee Jansky [[id number]]   benzonatate (TESSALON) 100 MG capsule    Sig: Take 1-2 capsules (100-200 mg total) by mouth 3 (three) times daily as needed.    Dispense:  30 capsule    Refill:  0    Order Specific Question:   Supervising Provider    Answer:   Merrilee Jansky [id number]     *If you need refills on other medications prior to your next appointment, please contact your  pharmacy*  Follow-Up: Call back or seek an in-person evaluation if the symptoms worsen or if the condition fails to improve as anticipated.  [facility] [phone]  Other Instructions Upper Respiratory Infection, Adult An upper respiratory infection (URI) is a common viral infection of the nose, throat, and upper air passages that lead to the lungs. The most common type of URI is the common cold. URIs usually get better on their own, without medical treatment. What are the causes? A URI is caused by a virus. You may catch a virus by: Breathing in droplets from an infected person's cough or sneeze. Touching something that has been exposed to the virus (is contaminated) and then touching your mouth, nose, or eyes. What increases the risk? You are more likely to get a URI if: You are very young or very old. You have close contact with others, such as at work, school, or a health care facility. You smoke. You have long-term (chronic) heart or lung disease. You have a weakened disease-fighting system (immune system). You have nasal allergies or asthma. You are experiencing a lot of stress. You have poor nutrition. What are the signs or symptoms? A URI usually involves some of the following symptoms: Runny or stuffy (congested) nose. Cough. Sneezing. Sore throat. Headache. Fatigue. Fever. Loss of appetite. Pain in your forehead, behind your eyes, and over your cheekbones (sinus pain). Muscle aches. Redness or irritation of the eyes. Pressure in the ears or face. How is this diagnosed? This condition may be diagnosed based on your medical history and symptoms, and a physical exam. Your health care provider may use a swab to take a mucus sample from your nose (nasal swab). This sample can be tested to determine what virus is causing the illness. How is this treated? URIs usually get better on their own within 7-10 days. Medicines cannot cure URIs, but your health  care provider may recommend certain medicines to help relieve symptoms, such as: Over-the-counter cold medicines. Cough suppressants. Coughing is a type of defense against infection that helps to clear the respiratory system, so take these medicines only as recommended by your health care provider. Fever-reducing medicines. Follow these instructions at home: Activity Rest as needed. If you have a fever, stay home from work or school until your fever is gone or until your health care provider says your URI cannot spread to other people (is no longer contagious). Your health care provider may have you wear a face mask to prevent your infection from spreading. Relieving symptoms Gargle with a mixture of salt and water 3-4 times a day or as needed. To make salt water, completely dissolve -1 tsp (3-6 g) of salt in 1  cup (237 mL) of warm water. Use a cool-mist humidifier to add moisture to the air. This can help you breathe more easily. Eating and drinking  Drink enough fluid to keep your urine pale yellow. Eat soups and other clear broths. General instructions  Take over-the-counter and prescription medicines only as told by your health care provider. These include cold medicines, fever reducers, and cough suppressants. Do not use any products that contain nicotine or tobacco. These products include cigarettes, chewing tobacco, and vaping devices, such as e-cigarettes. If you need help quitting, ask your health care provider. Stay away from secondhand smoke. Stay up to date on all immunizations, including the yearly (annual) flu vaccine. Keep all follow-up visits. This is important. How to prevent the spread of infection to others URIs can be contagious. To prevent the infection from spreading: Wash your hands with soap and water for at least 20 seconds. If soap and water are not available, use hand sanitizer. Avoid touching your mouth, face, eyes, or nose. Cough or sneeze into a tissue or your  sleeve or elbow instead of into your hand or into the air.  Contact a health care provider if: You are getting worse instead of better. You have a fever or chills. Your mucus is brown or red. You have yellow or brown discharge coming from your nose. You have pain in your face, especially when you bend forward. You have swollen neck glands. You have pain while swallowing. You have white areas in the back of your throat. Get help right away if: You have shortness of breath that gets worse. You have severe or persistent: Headache. Ear pain. Sinus pain. Chest pain. You have chronic lung disease along with any of the following: Making high-pitched whistling sounds when you breathe, most often when you breathe out (wheezing). Prolonged cough (more than 14 days). Coughing up blood. A change in your usual mucus. You have a stiff neck. You have changes in your: Vision. Hearing. Thinking. Mood. These symptoms may be an emergency. Get help right away. Call 911. Do not wait to see if the symptoms will go away. Do not drive yourself to the hospital. Summary An upper respiratory infection (URI) is a common infection of the nose, throat, and upper air passages that lead to the lungs. A URI is caused by a virus. URIs usually get better on their own within 7-10 days. Medicines cannot cure URIs, but your health care provider may recommend certain medicines to help relieve symptoms. This information is not intended to replace advice given to you by your health care provider. Make sure you discuss any questions you have with your health care provider. Document Revised: 06/02/2021 Document Reviewed: 06/02/2021 Elsevier Patient Education  2024 [employer]vier Inc.    If you have been instructed to have an in-person evaluation today at a local Urgent Care facility, please use the link below. It will take you to a list of all of our available [facility], including address, phone number and  hours of operation. Please do not delay care.  [facility]  If you or a family member do not have a primary care provider, use the link below to schedule a visit and establish care. When you choose a [facility] primary care physician or advanced practice provider, you gain a long-term partner in health. Find a Primary Care Provider  Learn more about [facility]'s in-office and virtual care options: [facility] - Get Care Now

## 2023-10-02 NOTE — Progress Notes (Signed)
Virtual Visit Consent   Joshua [name], you are scheduled for a virtual visit with a Joshua [name] provider today. Just as with appointments in the office, your consent must be obtained to participate. Your consent will be active for this visit and any virtual visit you may have with one of our providers in the next 365 days. If you have a MyChart account, a copy of this consent can be sent to you electronically.  As this is a virtual visit, video technology does not allow for your provider to perform a traditional examination. This may limit your provider's ability to fully assess your condition. If your provider identifies any concerns that need to be evaluated in person or the need to arrange testing (such as labs, EKG, etc.), we will make arrangements to do so. Although advances in technology are sophisticated, we cannot ensure that it will always work on either your end or our end. If the connection with a video visit is poor, the visit may have to be switched to a telephone visit. With either a video or telephone visit, we are not always able to ensure that we have a secure connection.  By engaging in this virtual visit, you consent to the provision of healthcare and authorize for your insurance to be billed (if applicable) for the services provided during this visit. Depending on your insurance coverage, you may receive a charge related to this service.  I need to obtain your verbal consent now. Are you willing to proceed with your visit today? Joshua [name] has provided verbal consent on 10/02/2023 for a virtual visit (video or telephone). Margaretann Loveless, PA-C  Date: 10/02/2023 9:30 AM  Virtual Visit via Video Note   I, Margaretann Loveless, connected with  Joshua [name]  ([id number], [birth year]) on 10/02/23 at  9:30 AM EST by a video-enabled telemedicine application and verified that I am speaking with the correct person using two identifiers.  Location: Patient: Virtual Visit Location  Patient: Home Provider: Virtual Visit Location Provider: Home Office   I discussed the limitations of evaluation and management by telemedicine and the availability of in person appointments. The patient expressed understanding and agreed to proceed.    History of Present Illness: Joshua [name] is a 22 y.o. who identifies as a male who was assigned male at birth, and is being seen today for URI symptoms.  HPI: URI  This is a new problem. The current episode started 1 to 4 weeks ago. The problem has been gradually worsening. There has been no fever. Associated symptoms include congestion, coughing, headaches, a plugged ear sensation (bilateral), rhinorrhea (and post nasal drainage), sinus pain and a sore throat. Pertinent negatives include no diarrhea, ear pain, nausea, vomiting or wheezing. Associated symptoms comments: Fatigue, soreness from coughing, hot and cold flashes. Treatments tried: dayquil, nyquil, cough drops, saline nasal rinse. The treatment provided no relief.     Problems:  Patient Active Problem List   Diagnosis Date Noted   Generalized anxiety disorder with panic attacks 08/01/2023   Tobacco use 08/01/2023   Prediabetes 08/01/2023   Common migraine with intractable migraine 05/30/2017   Dizziness and giddiness 05/30/2017   Subdural hematoma (HCC)    TBI (traumatic brain injury) (HCC) 06/14/2016    Allergies:  Allergies  Allergen Reactions   Penicillin G     Other Reaction(s): Other (See Comments)  Does not remember   Penicillins Hives   Zofran [Ondansetron Hcl] Other (See Comments)    dizziness   Medications:  Current Outpatient Medications:    benzonatate (TESSALON) 100 MG capsule, Take 1-2 capsules (100-200 mg total) by mouth 3 (three) times daily as needed., Disp: 30 capsule, Rfl: 0   brompheniramine-pseudoephedrine-DM 30-2-10 MG/5ML syrup, Take 5 mLs by mouth 4 (four) times daily as needed., Disp: 120 mL, Rfl: 0   clonazePAM (KLONOPIN) 0.5 MG tablet, Take 0.5  mg by mouth 2 (two) times daily., Disp: , Rfl:    clonazePAM (KLONOPIN) 0.5 MG tablet, Take 1 tablet (0.5 mg total) by mouth 2 (two) times daily as needed for anxiety., Disp: 30 tablet, Rfl: 1   dicyclomine (BENTYL) 10 MG capsule, Take 1 capsule (10 mg total) by mouth 4 (four) times daily -  before meals and at bedtime. (Patient not taking: Reported on 09/04/2023), Disp: 40 capsule, Rfl: 0   doxycycline (VIBRA-TABS) 100 MG tablet, Take 1 tablet (100 mg total) by mouth 2 (two) times daily., Disp: 20 tablet, Rfl: 0   loperamide (IMODIUM A-D) 2 MG tablet, Take 1 tablet (2 mg total) by mouth 4 (four) times daily as needed for diarrhea or loose stools., Disp: 30 tablet, Rfl: 0   ondansetron (ZOFRAN-ODT) 4 MG disintegrating tablet, Take 4 mg by mouth every 8 (eight) hours as needed. (Patient not taking: Reported on 09/04/2023), Disp: , Rfl:    pantoprazole (PROTONIX) 20 MG tablet, Take 1 tablet (20 mg total) by mouth daily. (Patient not taking: Reported on 09/04/2023), Disp: 30 tablet, Rfl: 0   predniSONE (DELTASONE) 20 MG tablet, Take 2 tablets (40 mg total) by mouth daily with breakfast., Disp: 10 tablet, Rfl: 0   rizatriptan (MAXALT-MLT) 10 MG disintegrating tablet, Take 1 tablet (10 mg total) by mouth as needed for migraine. May repeat in 2 hours if needed (Patient not taking: Reported on 09/04/2023), Disp: 9 tablet, Rfl: 11   sucralfate (CARAFATE) 1 g tablet, Take 1 tablet (1 g total) by mouth 4 (four) times daily -  with meals and at bedtime. (Patient not taking: Reported on 09/04/2023), Disp: 40 tablet, Rfl: 0   topiramate (TOPAMAX) 50 MG tablet, Take 1 tablet (50 mg total) by mouth 2 (two) times daily., Disp: 60 tablet, Rfl: 12  Observations/Objective: Patient is well-developed, well-nourished in no acute distress.  Resting comfortably at home.  Head is normocephalic, atraumatic.  No labored breathing.  Speech is clear and coherent with logical content.  Patient is alert and oriented at baseline.     Assessment and Plan: 1. Bacterial upper respiratory infection - doxycycline (VIBRA-TABS) 100 MG tablet; Take 1 tablet (100 mg total) by mouth 2 (two) times daily.  Dispense: 20 tablet; Refill: 0 - predniSONE (DELTASONE) 20 MG tablet; Take 2 tablets (40 mg total) by mouth daily with breakfast.  Dispense: 10 tablet; Refill: 0  - Worsening symptoms that have not responded to OTC medications.  - Will give Doxycycline, Prednisone, Bromfed DM and Tessalon - Continue allergy medications.  - Steam and humidifier can help - Stay well hydrated and get plenty of rest.  - Seek in person evaluation if no symptom improvement or if symptoms worsen   Follow Up Instructions: I discussed the assessment and treatment plan with the patient. The patient was provided an opportunity to ask questions and all were answered. The patient agreed with the plan and demonstrated an understanding of the instructions.  A copy of instructions were sent to the patient via MyChart unless otherwise noted below.    The patient was advised to call back or seek an in-person evaluation if  the symptoms worsen or if the condition fails to improve as anticipated.    Margaretann Loveless, PA-C

## 2023-10-04 ENCOUNTER — Ambulatory Visit: Payer: Medicaid Other | Admitting: Neurology

## 2023-10-04 DIAGNOSIS — R0683 Snoring: Secondary | ICD-10-CM

## 2023-10-04 DIAGNOSIS — G4719 Other hypersomnia: Secondary | ICD-10-CM

## 2023-10-04 DIAGNOSIS — G4733 Obstructive sleep apnea (adult) (pediatric): Secondary | ICD-10-CM | POA: Diagnosis not present

## 2023-10-04 DIAGNOSIS — Z9189 Other specified personal risk factors, not elsewhere classified: Secondary | ICD-10-CM

## 2023-10-04 DIAGNOSIS — R0681 Apnea, not elsewhere classified: Secondary | ICD-10-CM

## 2023-10-04 DIAGNOSIS — R519 Headache, unspecified: Secondary | ICD-10-CM

## 2023-10-04 DIAGNOSIS — E669 Obesity, unspecified: Secondary | ICD-10-CM

## 2023-10-04 DIAGNOSIS — Z820 Family history of epilepsy and other diseases of the nervous system: Secondary | ICD-10-CM

## 2023-10-05 NOTE — Progress Notes (Signed)
See procedure note.

## 2023-10-09 NOTE — Addendum Note (Signed)
Addended by: Huston Foley on: 10/09/2023 06:11 PM   Modules accepted: Orders

## 2023-10-09 NOTE — Procedures (Signed)
   [facility]  HOME SLEEP TEST (Watch PAT) REPORT  STUDY DATE: 10/04/2023  DOB: September 27, 2001  MRN: [id number]  ORDERING CLINICIAN: Huston Foley, MD, PhD   REFERRING CLINICIAN: Loyola Mast, MD   CLINICAL INFORMATION/HISTORY: 22 year old Joshua [name] with an underlying medical history of migraine headaches, history of traumatic brain injury, history of subdural hematoma, anxiety, smoking, dizziness and obesity, who reports snoring and excessive daytime somnolence, as well as witnessed apneas.  Epworth sleepiness score: 15/24.  BMI: 37 kg/m  FINDINGS:   Sleep Summary:   Total Recording Time (hours, min): 9 hours, 41 min  Total Sleep Time (hours, min):  7 hours, 18 min  Percent REM (%):    29.7%   Respiratory Indices:   Calculated pAHI (per hour):  17.2/hour         REM pAHI:    16.5/hour       NREM pAHI: 17.5/hour  Central pAHI: 2.8/hour  Oxygen Saturation Statistics:    Oxygen Saturation (%) Mean: 95%   Minimum oxygen saturation (%):                 87%   O2 Saturation Range (%): 87 - 99%    O2 Saturation (minutes) <=88%: 0.2 min  Pulse Rate Statistics:   Pulse Mean (bpm):    75/min    Pulse Range (43 - 110/min)   IMPRESSION: OSA (obstructive sleep apnea), moderate  RECOMMENDATION:  This home sleep test demonstrates moderate obstructive sleep apnea with a total AHI of 17.2/hour and O2 nadir of 87%.  Intermittent snoring was detected, ranging from mild to moderate for the most part. Treatment with a positive airway pressure (PAP) device is recommended. The patient will be advised to proceed with an autoPAP titration/trial at home for now. A full night titration study may be considered to optimize treatment settings, monitor proper oxygen saturations and aid with improvement of tolerance and adherence, if needed down the road. Alternative treatment options may include a dental device through dentistry or orthodontics in selected patients or Inspire  (hypoglossal nerve stimulator) in carefully selected patients (meeting inclusion criteria).  Concomitant weight loss is recommended (where clinically appropriate). Please note that untreated obstructive sleep apnea may carry additional perioperative morbidity. Patients with significant obstructive sleep apnea should receive perioperative PAP therapy and the surgeons and particularly the anesthesiologist should be informed of the diagnosis and the severity of the sleep disordered breathing. The patient should be cautioned not to drive, work at heights, or operate dangerous or heavy equipment when tired or sleepy. Review and reiteration of good sleep hygiene measures should be pursued with any patient. Other causes of the patient's symptoms, including circadian rhythm disturbances, an underlying mood disorder, medication effect and/or an underlying medical problem cannot be ruled out based on this test. Clinical correlation is recommended.  The patient and his referring provider will be notified of the test results. The patient will be seen in follow up in sleep clinic at [facility].  I certify that I have reviewed the raw data recording prior to the issuance of this report in accordance with the standards of the American Academy of Sleep Medicine (AASM).    INTERPRETING PHYSICIAN:   Huston Foley, MD, PhD Medical Director, [facility]) Diplomat, ABPN (Neurology and Sleep)   [facility] [address] [place], [place] [address] [phone]

## 2023-10-20 ENCOUNTER — Other Ambulatory Visit: Payer: Self-pay

## 2023-10-20 ENCOUNTER — Telehealth: Payer: Medicaid Other | Admitting: Family Medicine

## 2023-10-20 ENCOUNTER — Encounter (HOSPITAL_COMMUNITY): Payer: Self-pay

## 2023-10-20 ENCOUNTER — Emergency Department (HOSPITAL_COMMUNITY)
Admission: EM | Admit: 2023-10-20 | Discharge: 2023-10-20 | Disposition: A | Discharge status: Home / Self Care | Payer: Medicaid Other | Attending: Student | Admitting: Student

## 2023-10-20 ENCOUNTER — Emergency Department (HOSPITAL_COMMUNITY): Payer: Medicaid Other

## 2023-10-20 DIAGNOSIS — M25511 Pain in right shoulder: Secondary | ICD-10-CM | POA: Insufficient documentation

## 2023-10-20 DIAGNOSIS — T148XXA Other injury of unspecified body region, initial encounter: Secondary | ICD-10-CM

## 2023-10-20 DIAGNOSIS — M542 Cervicalgia: Secondary | ICD-10-CM | POA: Diagnosis present

## 2023-10-20 MED ORDER — CYCLOBENZAPRINE HCL 10 MG PO TABS
10.0000 mg | ORAL_TABLET | Freq: Once | ORAL | Status: AC
  Administered 2023-10-20: 10 mg via ORAL
  Filled 2023-10-20: qty 1

## 2023-10-20 MED ORDER — CYCLOBENZAPRINE HCL 10 MG PO TABS
10.0000 mg | ORAL_TABLET | Freq: Two times a day (BID) | ORAL | 0 refills | Status: AC | PRN
Start: 2023-10-20 — End: ?

## 2023-10-20 MED ORDER — ACETAMINOPHEN 500 MG PO TABS
1000.0000 mg | ORAL_TABLET | Freq: Once | ORAL | Status: AC
  Administered 2023-10-20: 1000 mg via ORAL
  Filled 2023-10-20: qty 2

## 2023-10-20 MED ORDER — KETOROLAC TROMETHAMINE 15 MG/ML IJ SOLN
15.0000 mg | Freq: Once | INTRAMUSCULAR | Status: AC
  Administered 2023-10-20: 15 mg via INTRAMUSCULAR
  Filled 2023-10-20: qty 1

## 2023-10-20 MED ORDER — LIDOCAINE 5 % EX PTCH
2.0000 | MEDICATED_PATCH | CUTANEOUS | Status: DC
  Administered 2023-10-20: 2 via TRANSDERMAL
  Filled 2023-10-20: qty 2

## 2023-10-20 MED ORDER — NAPROXEN 375 MG PO TABS
375.0000 mg | ORAL_TABLET | Freq: Two times a day (BID) | ORAL | 0 refills | Status: AC
Start: 2023-10-20 — End: 2023-11-03

## 2023-10-20 NOTE — ED Triage Notes (Signed)
C/o right shoulder pain with numbness and tingling to right hand x2 weeks. Patient reports pain radiating into back with rotation of head. Pt reports picking up trailer 2 weeks ago before pain started.

## 2023-10-20 NOTE — ED Provider Notes (Signed)
[place] EMERGENCY DEPARTMENT AT [facility] Provider Note   CSN: [id number] Arrival date & time: 10/20/23  1305     History  Chief Complaint  Patient presents with   Shoulder Pain    Joshua [name] is a 22 y.o. male with a past medical history of injury to T11 presents emergency department for evaluation of neck and right shoulder pain that started 2 weeks ago when attempting to pick up a trailer.  He reports that pain has been debilitating and persistent.  He endorses occasional "numbness and tingling" to right hand.  Today, he reports limited range of motion of neck secondary to stiffness and pain.   Shoulder Pain Associated symptoms: back pain and neck pain   Associated symptoms: no fatigue and no fever      Home Medications Prior to Admission medications   Medication Sig Start Date End Date Taking? Authorizing Provider  benzonatate (TESSALON) 100 MG capsule Take 1-2 capsules (100-200 mg total) by mouth 3 (three) times daily as needed. 10/02/23   Margaretann Loveless, PA-C  brompheniramine-pseudoephedrine-DM 30-2-10 MG/5ML syrup Take 5 mLs by mouth 4 (four) times daily as needed. 10/02/23   Margaretann Loveless, PA-C  clonazePAM (KLONOPIN) 0.5 MG tablet Take 1 tablet (0.5 mg total) by mouth 2 (two) times daily as needed for anxiety. 10/02/23   Nche, Bonna Gains, NP  dicyclomine (BENTYL) 10 MG capsule Take 1 capsule (10 mg total) by mouth 4 (four) times daily -  before meals and at bedtime. Patient not taking: Reported on 09/04/2023 08/17/23   Margaretann Loveless, PA-C  doxycycline (VIBRA-TABS) 100 MG tablet Take 1 tablet (100 mg total) by mouth 2 (two) times daily. 10/02/23   Margaretann Loveless, PA-C  loperamide (IMODIUM A-D) 2 MG tablet Take 1 tablet (2 mg total) by mouth 4 (four) times daily as needed for diarrhea or loose stools. 08/17/23   Margaretann Loveless, PA-C  ondansetron (ZOFRAN-ODT) 4 MG disintegrating tablet Take 4 mg by mouth every 8 (eight) hours as  needed. Patient not taking: Reported on 09/04/2023 05/17/23   [provider]  pantoprazole (PROTONIX) 20 MG tablet Take 1 tablet (20 mg total) by mouth daily. Patient not taking: Reported on 09/04/2023 08/26/23   Fayrene Helper, PA-C  predniSONE (DELTASONE) 20 MG tablet Take 2 tablets (40 mg total) by mouth daily with breakfast. 10/02/23   Margaretann Loveless, PA-C  rizatriptan (MAXALT-MLT) 10 MG disintegrating tablet Take 1 tablet (10 mg total) by mouth as needed for migraine. May repeat in 2 hours if needed Patient not taking: Reported on 09/04/2023 08/08/23   Penumalli, Glenford Bayley, MD  sucralfate (CARAFATE) 1 g tablet Take 1 tablet (1 g total) by mouth 4 (four) times daily -  with meals and at bedtime. Patient not taking: Reported on 09/04/2023 08/17/23   Margaretann Loveless, PA-C  topiramate (TOPAMAX) 50 MG tablet Take 1 tablet (50 mg total) by mouth 2 (two) times daily. 08/08/23   Penumalli, Glenford Bayley, MD      Allergies    Penicillin g, Penicillins, and Zofran [ondansetron hcl]    Review of Systems   Review of Systems  Constitutional:  Negative for chills, fatigue and fever.  Respiratory:  Negative for cough, chest tightness, shortness of breath and wheezing.   Cardiovascular:  Negative for chest pain and palpitations.  Gastrointestinal:  Negative for abdominal pain, constipation, diarrhea, nausea and vomiting.  Musculoskeletal:  Positive for back pain, myalgias and neck pain.  Neurological:  Negative for dizziness, seizures, weakness, light-headedness, numbness and headaches.    Physical Exam Updated Vital Signs BP (!) 131/98 (BP Location: Left Arm)   Pulse 83   Temp 98 F (36.7 C) (Oral)   Resp 18   Ht 6' (1.829 m)   Wt 124.7 kg   SpO2 98%   BMI 37.30 kg/m  Physical Exam Vitals and nursing note reviewed.  Constitutional:      General: He is not in acute distress.    Appearance: Normal appearance.  HENT:     Head: Normocephalic and atraumatic.     Nose: Nose normal.   Eyes:     Extraocular Movements: Extraocular movements intact.     Conjunctiva/sclera: Conjunctivae normal.     Pupils: Pupils are equal, round, and reactive to light.  Cardiovascular:     Rate and Rhythm: Normal rate.     Pulses:          Radial pulses are 2+ on the right side and 2+ on the left side.  Pulmonary:     Effort: Pulmonary effort is normal. No respiratory distress.  Abdominal:     General: Bowel sounds are normal. There is no distension.     Palpations: Abdomen is soft.     Tenderness: There is no abdominal tenderness. There is no guarding or rebound.  Musculoskeletal:        General: No swelling or deformity.     Cervical back: Normal range of motion. Tenderness (of right paraspinous musculature) present. No crepitus. Pain with movement, spinous process tenderness and muscular tenderness present.     Right lower leg: No edema.     Left lower leg: No edema.     Comments: Decreased range of motion of right shoulder with abduction Tenderness to palpation of trapezius musculature No swelling, tenderness to palpation of shoulder Motor 5/5 and sensation equal of BLE  Skin:    General: Skin is warm.     Coloration: Skin is not jaundiced or pale.     Findings: No bruising, erythema or rash.  Neurological:     Mental Status: He is alert and oriented to person, place, and time. Mental status is at baseline.     Cranial Nerves: No cranial nerve deficit.     Sensory: No sensory deficit.     Motor: No weakness.     Coordination: Coordination normal.     Gait: Gait normal.     Deep Tendon Reflexes: Reflexes normal.     Comments: Ambulated without difficulty     ED Results / Procedures / Treatments   Labs (all labs ordered are listed, but only abnormal results are displayed) Labs Reviewed - No data to display  EKG None  Radiology No results found.  Procedures Procedures    Medications Ordered in ED Medications  ketorolac (TORADOL) 15 MG/ML injection 15 mg (has  no administration in time range)  lidocaine (LIDODERM) 5 % 2 patch (has no administration in time range)  cyclobenzaprine (FLEXERIL) tablet 10 mg (has no administration in time range)    ED Course/ Medical Decision Making/ A&P                                 Medical Decision Making Amount and/or Complexity of Data Reviewed Radiology: ordered.  Risk OTC drugs. Prescription drug management.   Patient presents to the ED for concern of neck and low back right shoulder pain, this involves  an extensive number of treatment options, and is a complaint that carries with it a high risk of complications and morbidity.  The differential diagnosis includes muscle strain, fracture, disc herniation.  This is not exhaustive list    Co morbidities that complicate the patient evaluation   injury to T11   Additional history obtained:  Additional history obtained from Family, Nursing, and Outside Medical Records   External records from outside source obtained and reviewed including triage RN note   Imaging Studies ordered:  I ordered imaging studies including cervical neck x-ray I independently visualized and interpreted imaging which showed no acute fractures or obvious osseous injury I agree with the radiologist interpretation    Medicines ordered and prescription drug management:  I ordered medication including Tylenol, Flexeril, Toradol, lidocaine patches in ED for pain management and Flexeril and naproxen prescription for outpatient pain management Reevaluation of the patient after these medicines showed that the patient improved I have reviewed the patients home medicines and have made adjustments as needed    Problem List / ED Course:  Muscle strain Patient has multiple areas of tenderness to palpation of paraspinous regions in right cervical and right trapezius musculature following picking up a trailer No swelling, warmth, erythema to right shoulder to indicate septic or  fluid filled joint He is neurologically intact with equal motor and sensation BLE Provided lidocaine patch, Toradol, Flexeril, Tylenol in ED significantly improving pain Provide prescription for Flexeril and naproxen outpatient.  Discussed not using EtOH or operate heavy machinery with Flexeril Discussed not using naproxen with Advil, Aleve, aspirin Discussed return to emergency department precautions Patient agrees with disposition and expresses understanding with plan and recommendations.  All questions answered to her satisfaction.   Reevaluation:  After the interventions noted above, I reevaluated the patient and found that they have :improved   Social Determinants of Health:  Has PCP   Dispostion:  After consideration of the diagnostic results and the patients response to treatment, I feel that the patent would benefit from outpatient management.   Final Clinical Impression(s) / ED Diagnoses Final diagnoses:  None    Rx / DC Orders ED Discharge Orders     None        Judithann Sheen, PA 10/20/23 2159    Glendora Score, MD 10/23/23 [phone]

## 2023-10-20 NOTE — Progress Notes (Signed)
Pt did not show for visit. DWB 

## 2023-10-20 NOTE — Discharge Instructions (Addendum)
Thank you for letting us evaluate you today.  We gave you strong ibuprofen, Flexeril, Tylenol, lidocaine patches in emergency department which improved symptoms.  Your x-ray of your neck was negative for fracture I will give you an orthopedic referral that you can schedule an appointment for further management.  I have sent naproxen Flexeril to your [facility] on [address].  Do not drink alcohol or operate heavy machinery on muscle relaxer (Flexeril).  This can make you drowsy so sometimes people take this only at night.  You can take Tylenol and/or ibuprofen this evening (8 PM).  Try using lidocaine patches, IcyHot, Voltaren gel or found OTC at the pharmacy for pain.  Please return to ED if symptoms worsen.

## 2023-10-23 ENCOUNTER — Telehealth: Payer: Self-pay

## 2023-10-23 NOTE — Transitions of Care (Post Inpatient/ED Visit) (Signed)
   10/23/2023  Name: Phelps Eiche MRN: [id number] DOB: 2001/04/13  Today's TOC FU Call Status: Today's TOC FU Call Status:: Successful TOC FU Call Completed TOC FU Call Complete Date: 10/23/23 Patient's Name and Date of Birth confirmed.  Transition Care Management Follow-up Telephone Call Date of Discharge: 10/20/23 Discharge Facility: [facility] Type of Discharge: Emergency Department How have you been since you were released from the hospital?: Worse Any questions or concerns?: Yes Patient Questions/Concerns:: Pain management  Items Reviewed: Did you receive and understand the discharge instructions provided?: Yes Any new allergies since your discharge?: No Dietary orders reviewed?: NA Do you have support at home?: Yes  Medications Reviewed Today: Medications Reviewed Today   Medications were not reviewed in this encounter     Home Care and Equipment/Supplies: Were Home Health Services Ordered?: NA Any new equipment or medical supplies ordered?: NA  Functional Questionnaire: Do you need assistance with bathing/showering or dressing?: No Do you need assistance with meal preparation?: No Do you need assistance with eating?: No Do you have difficulty maintaining continence: No Do you need assistance with getting out of bed/getting out of a chair/moving?: No Do you have difficulty managing or taking your medications?: No  Follow up appointments reviewed: PCP Follow-up appointment confirmed?: Yes Date of PCP follow-up appointment?: 10/25/23 Follow-up Provider: [facility] Follow-up appointment confirmed?: No Do you need transportation to your follow-up appointment?: No Do you understand care options if your condition(s) worsen?: Yes-patient verbalized understanding    SIGNATURE Arvil Persons, BSN, RN

## 2023-10-25 ENCOUNTER — Telehealth: Payer: Self-pay | Admitting: Family Medicine

## 2023-10-25 ENCOUNTER — Inpatient Hospital Stay: Payer: Medicaid Other | Admitting: Family Medicine

## 2023-10-25 NOTE — Telephone Encounter (Signed)
Pt thought appt today was a virtual visit - pt rescheduled for 12/12

## 2023-10-25 NOTE — Telephone Encounter (Signed)
Pt did not show up for appt today. Pls advise.

## 2023-10-26 ENCOUNTER — Inpatient Hospital Stay: Payer: Medicaid Other | Admitting: Family Medicine

## 2023-10-30 ENCOUNTER — Telehealth: Payer: Medicaid Other | Admitting: Family Medicine

## 2023-10-30 ENCOUNTER — Ambulatory Visit (INDEPENDENT_AMBULATORY_CARE_PROVIDER_SITE_OTHER): Payer: Medicaid Other

## 2023-10-30 ENCOUNTER — Ambulatory Visit
Admission: EM | Admit: 2023-10-30 | Discharge: 2023-10-30 | Disposition: A | Discharge status: Home / Self Care | Payer: Medicaid Other | Attending: Family Medicine | Admitting: Family Medicine

## 2023-10-30 DIAGNOSIS — R079 Chest pain, unspecified: Secondary | ICD-10-CM | POA: Diagnosis not present

## 2023-10-30 DIAGNOSIS — R062 Wheezing: Secondary | ICD-10-CM | POA: Diagnosis not present

## 2023-10-30 DIAGNOSIS — R091 Pleurisy: Secondary | ICD-10-CM

## 2023-10-30 MED ORDER — ALBUTEROL SULFATE HFA 108 (90 BASE) MCG/ACT IN AERS: 2.0000 | INHALATION_SPRAY | RESPIRATORY_TRACT | 0 refills | Status: AC | PRN

## 2023-10-30 NOTE — ED Provider Notes (Signed)
[facility]    CSN: [id number] Arrival date & time: 10/30/23  1342      History   Chief Complaint Chief Complaint  Patient presents with   Chest Pain    HPI Antoinette Walls is a 22 y.o. male.    Chest Pain Associated symptoms: no cough    Patient is here for left sided chest pain that started about 2 days ago.  He had URI symptoms, used otc medications, and then with pain at the left side of the chest when he woke up.  Today when he takes a deep breath he feels sharp pains at the left mid back, in addition to the left chest pain.  He sleeps with his left arm, and thought that could be related.  No cough.  No fevers/chills.  No sob.  He smokes 1/2 ppd, he vapes in the house.        Past Medical History:  Diagnosis Date   Anxiety    Asthma    Closed head injury    Common migraine with intractable migraine 05/30/2017   Diarrhea    History of pelvic fracture    History of spinal fracture    Medical history non-contributory    Seasonal allergies    Vomiting     Patient Active Problem List   Diagnosis Date Noted   Generalized anxiety disorder with panic attacks 08/01/2023   Tobacco use 08/01/2023   Prediabetes 08/01/2023   Common migraine with intractable migraine 05/30/2017   Dizziness and giddiness 05/30/2017   Subdural hematoma (HCC)    TBI (traumatic brain injury) (HCC) 06/14/2016    Past Surgical History:  Procedure Laterality Date   NO PAST SURGERIES         Home Medications    Prior to Admission medications   Medication Sig Start Date End Date Taking? Authorizing Provider  DM-Phenylephrine-Acetaminophen (VICKS DAYQUIL COLD & FLU PO) Take by mouth.   Yes [provider]  doxycycline (VIBRA-TABS) 100 MG tablet Take 1 tablet (100 mg total) by mouth 2 (two) times daily. 10/02/23  Yes Margaretann Loveless, PA-C  benzonatate (TESSALON) 100 MG capsule Take 1-2 capsules (100-200 mg total) by mouth 3 (three) times daily as needed.  10/02/23   Margaretann Loveless, PA-C  brompheniramine-pseudoephedrine-DM 30-2-10 MG/5ML syrup Take 5 mLs by mouth 4 (four) times daily as needed. 10/02/23   Margaretann Loveless, PA-C  clonazePAM (KLONOPIN) 0.5 MG tablet Take 1 tablet (0.5 mg total) by mouth 2 (two) times daily as needed for anxiety. 10/02/23   Nche, Bonna Gains, NP  cyclobenzaprine (FLEXERIL) 10 MG tablet Take 1 tablet (10 mg total) by mouth 2 (two) times daily as needed for muscle spasms. 10/20/23   Judithann Sheen, PA  dicyclomine (BENTYL) 10 MG capsule Take 1 capsule (10 mg total) by mouth 4 (four) times daily -  before meals and at bedtime. Patient not taking: Reported on 09/04/2023 08/17/23   Margaretann Loveless, PA-C  loperamide (IMODIUM A-D) 2 MG tablet Take 1 tablet (2 mg total) by mouth 4 (four) times daily as needed for diarrhea or loose stools. 08/17/23   Margaretann Loveless, PA-C  naproxen (NAPROSYN) 375 MG tablet Take 1 tablet (375 mg total) by mouth 2 (two) times daily for 14 days. 10/20/23 11/03/23  Judithann Sheen, PA  ondansetron (ZOFRAN-ODT) 4 MG disintegrating tablet Take 4 mg by mouth every 8 (eight) hours as needed. Patient not taking: Reported on 09/04/2023 05/17/23   [provider]  pantoprazole (PROTONIX) 20 MG tablet Take 1 tablet (20 mg total) by mouth daily. Patient not taking: Reported on 09/04/2023 08/26/23   Fayrene Helper, PA-C  predniSONE (DELTASONE) 20 MG tablet Take 2 tablets (40 mg total) by mouth daily with breakfast. 10/02/23   Margaretann Loveless, PA-C  rizatriptan (MAXALT-MLT) 10 MG disintegrating tablet Take 1 tablet (10 mg total) by mouth as needed for migraine. May repeat in 2 hours if needed Patient not taking: Reported on 09/04/2023 08/08/23   Penumalli, Glenford Bayley, MD  sucralfate (CARAFATE) 1 g tablet Take 1 tablet (1 g total) by mouth 4 (four) times daily -  with meals and at bedtime. Patient not taking: Reported on 09/04/2023 08/17/23   Margaretann Loveless, PA-C  topiramate  (TOPAMAX) 50 MG tablet Take 1 tablet (50 mg total) by mouth 2 (two) times daily. 08/08/23   Penumalli, Glenford Bayley, MD    Family History Family History  Problem Relation Age of Onset   Hypertension Mother    Hypertension Sister    Diabetes Maternal Grandmother    Hypertension Maternal Grandmother    Cancer Maternal Grandfather    Hirschsprung's disease Neg Hx     Social History Social History   Tobacco Use   Smoking status: Every Day    Current packs/day: 0.50    Types: Cigarettes   Smokeless tobacco: Never  Vaping Use   Vaping status: Every Day   Substances: Nicotine, Flavoring  Substance Use Topics   Alcohol use: Yes    Comment: Socially    Drug use: Not Currently    Types: Marijuana    Comment: Last time 2-3 years     Allergies   Penicillin g, Penicillins, and Zofran [ondansetron hcl]   Review of Systems Review of Systems  Constitutional: Negative.   HENT: Negative.    Respiratory:  Negative for cough.   Cardiovascular:  Positive for chest pain.  Gastrointestinal: Negative.   Genitourinary: Negative.   Musculoskeletal: Negative.   Psychiatric/Behavioral: Negative.       Physical Exam Triage Vital Signs ED Triage Vitals [10/30/23 1350]  Encounter Vitals Group     BP (!) 140/84     Systolic BP Percentile      Diastolic BP Percentile      Pulse Rate 99     Resp 18     Temp 97.8 F (36.6 C)     Temp Source Oral     SpO2 98 %     Weight 270 lb (122.5 kg)     Height 6' (1.829 m)     Head Circumference      Peak Flow      Pain Score      Pain Loc      Pain Education      Exclude from Growth Chart    No data found.  Updated Vital Signs BP (!) 140/84 (BP Location: Left Arm)   Pulse 97   Temp 97.8 F (36.6 C) (Oral)   Resp 20   Ht 6' (1.829 m)   Wt 122.5 kg   SpO2 97%   BMI 36.62 kg/m   Visual Acuity Right Eye Distance:   Left Eye Distance:   Bilateral Distance:    Right Eye Near:   Left Eye Near:    Bilateral Near:     Physical  Exam Constitutional:      Appearance: He is well-developed.  Cardiovascular:     Rate and Rhythm: Normal rate and regular rhythm.  Heart sounds: Normal heart sounds.  Pulmonary:     Effort: Pulmonary effort is normal.     Comments: Mild end expiratory wheezes, cleared with coughing Neurological:     Mental Status: He is alert.      UC Treatments / Results  Labs (all labs ordered are listed, but only abnormal results are displayed) Labs Reviewed - No data to display  EKG NSR; normal EKG  Radiology No results found.  Procedures Procedures (including critical care time)  Medications Ordered in UC Medications - No data to display  Initial Impression / Assessment and Plan / UC Course  I have reviewed the triage vital signs and the nursing notes.  Pertinent labs & imaging results that were available during my care of the patient were reviewed by me and considered in my medical decision making (see chart for details).   Final Clinical Impressions(s) / UC Diagnoses   Final diagnoses:  Pleurisy  Chest pain, unspecified type  Wheezing     Discharge Instructions      You were seen today for chest pain.  Your chest xray appears normal.  Your EKG was normal.  I think this is a condition call pleurisy.  I have given you information on this today.  I recommend motrin for pain.  You need to stop smoking and avoid vaping.  I have given you information on this today.   I have sent out an inhaler for the wheezing noted today.  Please return if not improving, or you devel    ED Prescriptions     Medication Sig Dispense Auth. Provider   albuterol (VENTOLIN HFA) 108 (90 Base) MCG/ACT inhaler Inhale 2 puffs into the lungs every 4 (four) hours as needed for wheezing or shortness of breath. 1 each Jannifer Franklin, MD      PDMP not reviewed this encounter.   Jannifer Franklin, MD 10/30/23 [phone]

## 2023-10-30 NOTE — Discharge Instructions (Addendum)
You were seen today for chest pain.  Your chest xray appears normal.  Your EKG was normal.  I think this is a condition call pleurisy.  I have given you information on this today.  I recommend motrin for pain.  You need to stop smoking and avoid vaping.  I have given you information on this today.   I have sent out an inhaler for the wheezing noted today.  Please return if not improving, or you devel

## 2023-10-30 NOTE — ED Notes (Signed)
"  This pain started immediately after taking 2 other counter medications the night before and waking up to this pain".

## 2023-10-30 NOTE — ED Notes (Signed)
Vital signs stable. 

## 2023-10-30 NOTE — Progress Notes (Signed)
Patient is currently at [facility] in person.  Will be cancelling this appt.  [place]

## 2023-10-30 NOTE — ED Triage Notes (Signed)
"  This started about 2 nights ago, Following some cold symptoms started taking OTC medicine and I felt like I was have a heart attack due to left sided chest pain". This pain remains "and is sharp with a deep breath". No nausea. No dizziness.

## 2023-11-03 ENCOUNTER — Institutional Professional Consult (permissible substitution): Payer: Medicaid Other | Admitting: Diagnostic Neuroimaging

## 2023-11-14 ENCOUNTER — Ambulatory Visit (INDEPENDENT_AMBULATORY_CARE_PROVIDER_SITE_OTHER): Payer: Medicaid Other | Admitting: Family Medicine

## 2023-11-14 ENCOUNTER — Encounter: Payer: Self-pay | Admitting: Family Medicine

## 2023-11-14 VITALS — BP 128/82 | HR 96 | Temp 98.0°F | Ht 72.0 in | Wt 281.0 lb

## 2023-11-14 DIAGNOSIS — R0789 Other chest pain: Secondary | ICD-10-CM | POA: Diagnosis not present

## 2023-11-14 DIAGNOSIS — F411 Generalized anxiety disorder: Secondary | ICD-10-CM | POA: Diagnosis not present

## 2023-11-14 DIAGNOSIS — F410 Panic disorder [episodic paroxysmal anxiety] without agoraphobia: Secondary | ICD-10-CM | POA: Diagnosis not present

## 2023-11-14 MED ORDER — CLONAZEPAM 0.5 MG PO TABS
0.5000 mg | ORAL_TABLET | Freq: Two times a day (BID) | ORAL | 1 refills | Status: DC | PRN
Start: 2023-11-14 — End: 2024-03-04

## 2023-11-14 MED ORDER — DICLOFENAC SODIUM 75 MG PO TBEC
75.0000 mg | DELAYED_RELEASE_TABLET | Freq: Two times a day (BID) | ORAL | 0 refills | Status: DC
Start: 2023-11-14 — End: 2024-04-22

## 2023-11-14 NOTE — Assessment & Plan Note (Signed)
We discussed his use of clonazepam. I recommend he hold this to no more than two tablets 2-3 times per week.

## 2023-11-14 NOTE — Assessment & Plan Note (Signed)
 Reviewed prior records, including ED visits and prior EKGS. Joshua [name] has had multiple encounters in the past decade that included evaluation of atypical chest pain. He has had many EKGs and other cardiac workup that has not shown any underlying heart disease. His symptoms are suggestive of pleurisy. He does not feel his anxiety plays a role in this, as he has not had a panic attack in quite some time. I did discuss the way anxiety may exhibit with magnifying complaints such as non-cardiac chest pain.

## 2023-11-14 NOTE — Progress Notes (Signed)
 [facility]IMARY CARE [facility] [address] [place] [address] Dept: [phone] Dept Fax: [phone]  Office Visit  Subjective:    Patient ID: Joshua [name], male    DOB: [birth year], 22 y.o..   MRN: [id number]  Chief Complaint  Patient presents with   Chest Pain    C/o having pains in his chest off/on x 3 weeks.  Was told it was pleurisy.    History of Present Illness:  Patient is in today complaining of recurrence of pain in the left chest. Joshua [name] notes that he was seen in UC recently with this issue. He was evaluated and felt to have pleurisy. His pain had been of a burning quality, at times sharp, and associated with breathing. He was advised to take either ibuprofen  or naproxen  and give this time to resolve. He notes that his pain resolved after three days. However, for the past few days, it has recurred. He finds it hurts at times when he is not deep breathing. He also finds the pain has been shifting around. He notes he takes up to 5 200 mg ibuprofen  at a time. He also frequently uses Goody powders for headache. He denies any cough.  Joshua [name] has a history of anxiety with panic attacks. He is managed with clonazepam  0.5 mg 1/2 tab as needed, which he takes only a few times a week.   Past Medical History: Patient Active Problem List   Diagnosis Date Noted   Generalized anxiety disorder with panic attacks 08/01/2023   Tobacco use 08/01/2023   Prediabetes 08/01/2023   Common migraine with intractable migraine 05/30/2017   Dizziness and giddiness 05/30/2017   Subdural hematoma (HCC)    TBI (traumatic brain injury) (HCC) 06/14/2016   Past Surgical History:  Procedure Laterality Date   NO PAST SURGERIES     Family History  Problem Relation Age of Onset   Hypertension Mother    Hypertension Sister    Diabetes Maternal Grandmother    Hypertension Maternal Grandmother    Cancer Maternal Grandfather    Hirschsprung's disease Neg Hx     Outpatient Medications Prior to Visit  Medication Sig Dispense Refill   albuterol  (VENTOLIN  HFA) 108 (90 Base) MCG/ACT inhaler Inhale 2 puffs into the lungs every 4 (four) hours as needed for wheezing or shortness of breath. 1 each 0   cyclobenzaprine  (FLEXERIL ) 10 MG tablet Take 1 tablet (10 mg total) by mouth 2 (two) times daily as needed for muscle spasms. 20 tablet 0   clonazePAM  (KLONOPIN ) 0.5 MG tablet Take 1 tablet (0.5 mg total) by mouth 2 (two) times daily as needed for anxiety. 30 tablet 0   benzonatate  (TESSALON ) 100 MG capsule Take 1-2 capsules (100-200 mg total) by mouth 3 (three) times daily as needed. 30 capsule 0   brompheniramine-pseudoephedrine-DM 30-2-10 MG/5ML syrup Take 5 mLs by mouth 4 (four) times daily as needed. 120 mL 0   dicyclomine  (BENTYL ) 10 MG capsule Take 1 capsule (10 mg total) by mouth 4 (four) times daily -  before meals and at bedtime. (Patient not taking: Reported on 09/04/2023) 40 capsule 0   DM-Phenylephrine-Acetaminophen  (VICKS DAYQUIL COLD & FLU PO) Take by mouth.     doxycycline  (VIBRA -TABS) 100 MG tablet Take 1 tablet (100 mg total) by mouth 2 (two) times daily. 20 tablet 0   loperamide  (IMODIUM  A-D) 2 MG tablet Take 1 tablet (2 mg total) by mouth 4 (four) times daily as needed for diarrhea or loose stools.  30 tablet 0   predniSONE  (DELTASONE ) 20 MG tablet Take 2 tablets (40 mg total) by mouth daily with breakfast. 10 tablet 0   topiramate  (TOPAMAX ) 50 MG tablet Take 1 tablet (50 mg total) by mouth 2 (two) times daily. 60 tablet 12   No facility-administered medications prior to visit.   Allergies  Allergen Reactions   Penicillin G     Other Reaction(s): Other (See Comments)  Does not remember   Penicillins Hives   Zofran  [Ondansetron  Hcl] Other (See Comments)    dizziness     Objective:   Today's Vitals   11/14/23 1209  BP: 128/82  Pulse: 96  Temp: 98 F (36.7 C)  TempSrc: Temporal  SpO2: 97%  Weight: 281 lb (127.5 kg)  Height: 6'  (1.829 m)   Body mass index is 38.11 kg/m.   General: Well developed, well nourished. No acute distress. Lungs: Clear to auscultation bilaterally. No wheezing, rales or rhonchi. CV: RRR without murmurs or rubs. Pulses 2+ bilaterally. Psych: Alert and oriented. Normal mood and affect.  Health Maintenance Due  Topic Date Due   HPV VACCINES (2 - Male 2-dose series) 03/17/2011   HIV Screening  Never done   Hepatitis C Screening  Never done   Imaging: Chest x-ray (10/30/2023) IMPRESSION: No acute cardiopulmonary process.  EKG (10/30/2023)- Normal sinus rhythm    Assessment & Plan:   Problem List Items Addressed This Visit       Other   Atypical chest pain - Primary   Reviewed prior records, including ED visits and prior EKGS. Joshua [name] has had multiple encounters in the past decade that included evaluation of atypical chest pain. He has had many EKGs and other cardiac workup that has not shown any underlying heart disease. His symptoms are suggestive of pleurisy. He does not feel his anxiety plays a role in this, as he has not had a panic attack in quite some time. I did discuss the way anxiety may exhibit with magnifying complaints such as non-cardiac chest pain.      Relevant Medications   diclofenac  (VOLTAREN ) 75 MG EC tablet   Generalized anxiety disorder with panic attacks   We discussed his use of clonazepam . I recommend he hold this to no more than two tablets 2-3 times per week.      Relevant Medications   clonazePAM  (KLONOPIN ) 0.5 MG tablet   Return in about 2 weeks (around 11/28/2023) for Reassessment.   Garnette CHRISTELLA Simpler, MD

## 2023-12-11 ENCOUNTER — Telehealth (INDEPENDENT_AMBULATORY_CARE_PROVIDER_SITE_OTHER): Payer: Medicaid Other | Admitting: Diagnostic Neuroimaging

## 2023-12-11 ENCOUNTER — Telehealth: Payer: Self-pay | Admitting: Diagnostic Neuroimaging

## 2023-12-11 ENCOUNTER — Telehealth: Payer: Self-pay | Admitting: Neurology

## 2023-12-11 ENCOUNTER — Encounter: Payer: Self-pay | Admitting: Diagnostic Neuroimaging

## 2023-12-11 DIAGNOSIS — R42 Dizziness and giddiness: Secondary | ICD-10-CM | POA: Diagnosis not present

## 2023-12-11 DIAGNOSIS — G43709 Chronic migraine without aura, not intractable, without status migrainosus: Secondary | ICD-10-CM

## 2023-12-11 DIAGNOSIS — G4733 Obstructive sleep apnea (adult) (pediatric): Secondary | ICD-10-CM

## 2023-12-11 MED ORDER — AMITRIPTYLINE HCL 25 MG PO TABS: 25.0000 mg | ORAL_TABLET | Freq: Every day | ORAL | 3 refills | Status: DC

## 2023-12-11 MED ORDER — MECLIZINE HCL 12.5 MG PO TABS: 12.5000 mg | ORAL_TABLET | Freq: Two times a day (BID) | ORAL | 1 refills | Status: DC | PRN

## 2023-12-11 MED ORDER — NURTEC 75 MG PO TBDP: 75.0000 mg | ORAL_TABLET | Freq: Every day | ORAL | 6 refills | Status: AC | PRN

## 2023-12-11 NOTE — Telephone Encounter (Signed)
MRI order from September: [facility]: [id number] exp. 12/11/23-01/25/24 sent to GI [phone]
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
## 2023-12-11 NOTE — Patient Instructions (Addendum)
MIGRAINE PREVENTION (tried topiramate; caused side effects) LIFESTYLE CHANGES -Stop or avoid smoking -Decrease or avoid caffeine / alcohol -Eat and sleep on a regular schedule -Exercise several times per week - start amitriptyline 25mg  at bedtime  MIGRAINE RESCUE (tried rizatriptan; not effective) - maximum 5-10 doses of rescue medicine per month - ibuprofen, tylenol as needed - start rimegepant (Nurtec) 75mg  as needed for breakthrough headache; max 8 per month  INTERMITTENT DIZZINESS / LIGHTHEADEDNESS (since ~2017; post-concussion; multiple concussions / traumas; possible vestibular migraine) - follow up MRI brain   FATIGUE, SNORING, APNEA - follow up CPAP treatment based on home sleep study results

## 2023-12-11 NOTE — Telephone Encounter (Signed)
Pt called to check if neurologist was still going to get on. Had received a message neurologist was running behind. Send message to nurse and Dr. Marjory Lies.

## 2023-12-12 NOTE — Telephone Encounter (Addendum)
Bobbye Morton, CMA  [facility] Parents Tomie China; Angus Seller, Randal Buba, [place] New orders have been placed for the above pt, DOB: [birth year] Thanks  New, Maryruth Bun, Abbe Amsterdam, CMA; Alain Honey; Darcel Smalling; 1 other Received, thank you!

## 2023-12-18 ENCOUNTER — Other Ambulatory Visit: Payer: Self-pay | Admitting: Family Medicine

## 2023-12-18 DIAGNOSIS — F410 Panic disorder [episodic paroxysmal anxiety] without agoraphobia: Secondary | ICD-10-CM

## 2023-12-18 NOTE — Telephone Encounter (Signed)
Copied from CRM [id number]. Topic: General - Other >> Dec 18, 2023  3:30 PM Kathryne Eriksson wrote: Reason for CRM: clonazePAM (KLONOPIN) 0.5 MG tablet  Called patient no answer; left VM for call back if needed.

## 2023-12-18 NOTE — Telephone Encounter (Signed)
Copied from CRM [id number]. Topic: General - Other >> Dec 18, 2023  4:19 PM Truddie Crumble wrote: Reason for CRM: patient returning a call to [facility] from the office. Patient does not need the clonazepam medication refill anymore because he had a refill at the pharmacy  Noted message above.

## 2023-12-18 NOTE — Telephone Encounter (Signed)
Copied from CRM [id number]. Topic: Clinical - Medication Refill >> Dec 18, 2023  3:14 PM Lennart Pall wrote: Most Recent Primary Care Visit:  Provider: Loyola Mast  Department: [facility]  Visit Type: OFFICE VISIT  Date: 11/14/2023  Medication: clonazePAM  Has the patient contacted their pharmacy? Yes (Agent: If no, request that the patient contact the pharmacy for the refill. If patient does not wish to contact the pharmacy document the reason why and proceed with request.) (Agent: If yes, when and what did the pharmacy advise?)  Is this the correct pharmacy for this prescription? Yes If no, delete pharmacy and type the correct one.  This is the patient's preferred pharmacy:  [facility] [address], [place] - [address] [address] Phone: [phone] Fax: [phone]   Has the prescription been filled recently? Yes  Is the patient out of the medication? Yes  Has the patient been seen for an appointment in the last year OR does the patient have an upcoming appointment? Yes  Can we respond through MyChart? Yes  Agent: Please be advised that Rx refills may take up to 3 business days. We ask that you follow-up with your pharmacy.

## 2023-12-25 ENCOUNTER — Other Ambulatory Visit (HOSPITAL_COMMUNITY): Payer: Self-pay

## 2023-12-25 ENCOUNTER — Telehealth: Payer: Self-pay | Admitting: Pharmacist

## 2023-12-25 NOTE — Telephone Encounter (Signed)
 Pharmacy Patient Advocate Encounter   Received notification from CoverMyMeds that prior authorization for Nurtec 75MG  dispersible tablets is required/requested.   Insurance verification completed.   The patient is insured through [facility] .   Per test claim: PA required; PA submitted to above mentioned insurance via CoverMyMeds Key/confirmation #/EOC [id number] Status is pending

## 2024-01-03 ENCOUNTER — Telehealth: Payer: Self-pay

## 2024-01-03 ENCOUNTER — Other Ambulatory Visit (HOSPITAL_COMMUNITY): Payer: Self-pay

## 2024-01-03 MED ORDER — SUMATRIPTAN SUCCINATE 100 MG PO TABS: 100.0000 mg | ORAL_TABLET | Freq: Once | ORAL | 6 refills | Status: AC | PRN

## 2024-01-03 NOTE — Addendum Note (Signed)
Addended by: Joycelyn Schmid R on: 01/03/2024 01:23 PM   Modules accepted: Orders

## 2024-01-03 NOTE — Telephone Encounter (Signed)
Pharmacy Patient Advocate Encounter  Received notification from [facility] that Prior Authorization for Nurtec 75MG  dispersible  has been DENIED.  See denial reason below. No denial letter attached in CMM. Will attach denial letter to Media tab once received.   PA #/Case ID/Reference #: [id number]

## 2024-01-03 NOTE — Telephone Encounter (Signed)
PA request has been Submitted. New Encounter created for follow up. For additional info see Pharmacy Prior Auth telephone encounter from 01/03/2024.

## 2024-01-03 NOTE — Telephone Encounter (Signed)
Pharmacy Patient Advocate Encounter   Received notification from Patient Pharmacy that prior authorization for SUMAtriptan Succinate 100MG  tablets is required/requested.   Insurance verification completed.   The patient is insured through [facility] MEDICAID .   Per test claim: PA required; PA submitted to above mentioned insurance via CoverMyMeds Key/confirmation #/EOC [id number] Status is pending

## 2024-01-03 NOTE — Telephone Encounter (Signed)
Patient tried rizatriptan. Will try sumatriptan. Then if not working, try to get nurtec approved.   Meds ordered this encounter  Medications   SUMAtriptan (IMITREX) 100 MG tablet    Sig: Take 1 tablet (100 mg total) by mouth once as needed for migraine. May repeat x 1 after 2 hours; maximum 2 tabs per day and 8 tabs per month    Dispense:  8 tablet    Refill:  6    Suanne Marker, MD 01/03/2024, 1:23 PM Certified in Neurology, Neurophysiology and Neuroimaging  [facility] [address] [place], [place] [address] [phone]

## 2024-01-04 ENCOUNTER — Other Ambulatory Visit (HOSPITAL_COMMUNITY): Payer: Self-pay

## 2024-01-04 NOTE — Telephone Encounter (Signed)
   Pharmacy Patient Advocate Encounter   Received notification from Patient Pharmacy that prior authorization for Sumatriptan is required/requested.   Insurance verification completed.   The patient is insured through [facility] MEDICAID .   Per test claim: Refill too soon. PA is not needed at this time. Medication was filled 01/03/2024. Next eligible fill date is 01/26/2024.

## 2024-01-09 ENCOUNTER — Other Ambulatory Visit: Payer: Self-pay | Admitting: Diagnostic Neuroimaging

## 2024-01-10 NOTE — Telephone Encounter (Signed)
 Last seen on 12/11/23 per note " Return in about 6 months (around 06/09/2024) for MyChart visit (15 min).   No follow up scheduled

## 2024-01-22 ENCOUNTER — Telehealth: Admitting: Physician Assistant

## 2024-01-22 DIAGNOSIS — K120 Recurrent oral aphthae: Secondary | ICD-10-CM

## 2024-01-22 NOTE — Patient Instructions (Signed)
 Joshua [name], thank you for joining Margaretann Loveless, PA-C for today's virtual visit.  While this provider is not your primary care provider (PCP), if your PCP is located in our provider database this encounter information will be shared with them immediately following your visit.   A [facility] MyChart account gives you access to today's visit and all your visits, tests, and labs performed at [facility] " click here if you don't have a [facility] MyChart account or go to [email or web address]  Consent: (Patient) Joshua [name] provided verbal consent for this virtual visit at the beginning of the encounter.  Current Medications:  Current Outpatient Medications:    albuterol (VENTOLIN HFA) 108 (90 Base) MCG/ACT inhaler, Inhale 2 puffs into the lungs every 4 (four) hours as needed for wheezing or shortness of breath., Disp: 1 each, Rfl: 0   amitriptyline (ELAVIL) 25 MG tablet, TAKE 1 TABLET BY MOUTH EVERYDAY AT BEDTIME, Disp: 90 tablet, Rfl: 1   clonazePAM (KLONOPIN) 0.5 MG tablet, Take 1 tablet (0.5 mg total) by mouth 2 (two) times daily as needed for anxiety., Disp: 30 tablet, Rfl: 1   cyclobenzaprine (FLEXERIL) 10 MG tablet, Take 1 tablet (10 mg total) by mouth 2 (two) times daily as needed for muscle spasms., Disp: 20 tablet, Rfl: 0   diclofenac (VOLTAREN) 75 MG EC tablet, Take 1 tablet (75 mg total) by mouth 2 (two) times daily., Disp: 28 tablet, Rfl: 0   meclizine (ANTIVERT) 12.5 MG tablet, Take 1 tablet (12.5 mg total) by mouth 2 (two) times daily as needed for dizziness., Disp: 30 tablet, Rfl: 1   Rimegepant Sulfate (NURTEC) 75 MG TBDP, Take 1 tablet (75 mg total) by mouth daily as needed., Disp: 8 tablet, Rfl: 6   SUMAtriptan (IMITREX) 100 MG tablet, Take 1 tablet (100 mg total) by mouth once as needed for migraine. May repeat x 1 after 2 hours; maximum 2 tabs per day and 8 tabs per month, Disp: 8 tablet, Rfl: 6   Medications ordered in this encounter:  No orders of  the defined types were placed in this encounter.    *If you need refills on other medications prior to your next appointment, please contact your pharmacy*  Follow-Up: Call back or seek an in-person evaluation if the symptoms worsen or if the condition fails to improve as anticipated.   Virtual Care [phone]  Other Instructions  Canker Sores  Canker sores are small, painful sores that develop inside the mouth. You can get one or more canker sores on the inside of your lips or cheeks, on your tongue, or anywhere inside your mouth. Canker sores cannot be passed from person to person (are not contagious). These sores are different from the sores that you may get on the outside of your lips (cold sores or fever blisters). What are the causes? The cause of this condition is not known. The condition may be passed down from a parent (genetic). What increases the risk? This condition is more likely to develop in: Females. People in their teens or 52s. Females who are having their menstrual period. People who are under a lot of emotional stress. People who do not get enough iron or B vitamins. People who do not take care of their mouth and teeth (have poor oral hygiene). People who have an injury inside the mouth, such as after having dental work or from chewing something hard. What are the signs or symptoms? Canker sores usually start as painful red bumps.  Then they turn into small white, yellow, or gray sores that have red borders. The sores may be painful, and the pain may get worse when you eat or drink. In severe cases, along with the canker sore, symptoms may also include: Fever. Tiredness (fatigue). Swollen lymph nodes in your neck. How is this diagnosed? This condition may be diagnosed based on your symptoms and an exam of the inside of your mouth. If you get canker sores often or if they are very bad, you may have tests, such as: Blood tests to rule out possible  causes. Swabbing a fluid sample from the sore to be tested for infection. Removing a small tissue sample from the sore (biopsy). How is this treated? Most canker sores go away without treatment in about 1 week. Home care is usually the only treatment that you will need. Over-the-counter medicines can relieve discomfort. If you have severe canker sores, your health care provider may prescribe: Numbing ointment to relieve pain. Do not use products that contain benzocaine (including numbing gels) to treat teething or mouth pain in children who are younger than 2 years. These products may cause a rare but serious blood condition. Vitamins. Steroid medicines. These may be given as pills, mouth rinses, or gels. Antibiotic mouth rinse. Follow these instructions at home:  Apply, take, or use over-the-counter and prescription medicines only as told by your health care provider. These include vitamins and ointments. If you were prescribed an antibiotic mouth rinse, use it as told by your health care provider. Do not stop using the antibiotic even if your condition improves. Until the sores are healed: Do not drink coffee or citrus juices. Do not eat spicy or salty foods. Use a mild, over-the-counter mouth rinse as recommended by your health care provider. Take good care of your mouth and teeth (oral hygiene) by: Flossing your teeth every day. Brushing your teeth with a soft toothbrush twice each day. Contact a health care provider if: Your symptoms do not get better after 2 weeks. You also have a fever or swollen glands in your neck. You get canker sores often. You have a canker sore that is getting larger. You cannot eat or drink due to your canker sores. Summary Canker sores are small, painful sores that develop inside the mouth. Canker sores usually start as painful red bumps that turn into small white, yellow, or gray sores that have red borders. The sores may be painful, and the pain may get  worse when you eat or drink. Most canker sores clear up without treatment in about 1 week. Over-the-counter medicines can relieve discomfort. This information is not intended to replace advice given to you by your health care provider. Make sure you discuss any questions you have with your health care provider. Document Revised: 08/11/2021 Document Reviewed: 08/11/2021 Elsevier Patient Education  2024 Elsevier Inc.   If you have been instructed to have an in-person evaluation today at a local Urgent Care facility, please use the link below. It will take you to a list of all of our available [facility], including address, phone number and hours of operation. Please do not delay care.  [facility]  If you or a family member do not have a primary care provider, use the link below to schedule a visit and establish care. When you choose a [facility] primary care physician or advanced practice provider, you gain a long-term partner in health. Find a Primary Care Provider  Learn more  about [facility]'s in-office and virtual care options: [facility] - Get Care Now

## 2024-01-22 NOTE — Progress Notes (Signed)
 Virtual Visit Consent   Mak Bonny, you are scheduled for a virtual visit with a [facility] provider today. Just as with appointments in the office, your consent must be obtained to participate. Your consent will be active for this visit and any virtual visit you may have with one of our providers in the next 365 days. If you have a MyChart account, a copy of this consent can be sent to you electronically.  As this is a virtual visit, video technology does not allow for your provider to perform a traditional examination. This may limit your provider's ability to fully assess your condition. If your provider identifies any concerns that need to be evaluated in person or the need to arrange testing (such as labs, EKG, etc.), we will make arrangements to do so. Although advances in technology are sophisticated, we cannot ensure that it will always work on either your end or our end. If the connection with a video visit is poor, the visit may have to be switched to a telephone visit. With either a video or telephone visit, we are not always able to ensure that we have a secure connection.  By engaging in this virtual visit, you consent to the provision of healthcare and authorize for your insurance to be billed (if applicable) for the services provided during this visit. Depending on your insurance coverage, you may receive a charge related to this service.  I need to obtain your verbal consent now. Are you willing to proceed with your visit today? Vernard Gram has provided verbal consent on 01/22/2024 for a virtual visit (video or telephone). Margaretann Loveless, PA-C  Date: 01/22/2024 4:10 PM   Virtual Visit via Video Note   IMargaretann Loveless, connected with  Joshua [name]  ([id number], May 06, 2001) on 01/22/24 at  3:30 PM EDT by a video-enabled telemedicine application and verified that I am speaking with the correct person using two identifiers.  Location: Patient: Virtual Visit Location  Patient: Mobile Provider: Virtual Visit Location Provider: Home Office   I discussed the limitations of evaluation and management by telemedicine and the availability of in person appointments. The patient expressed understanding and agreed to proceed.    History of Present Illness: Joshua [name] is a 23 y.o. who identifies as a male who was assigned male at birth, and is being seen today for sore on tongue. 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 However, him and his significant other did split up for a short while and recently reconciled. They did perform oral sex on each other. The next day he developed the lesion on the tongue. He does have slim concern for STI as he is unsure if she was with anyone else during the time apart. He does mention that his significant other has history of cold sores, but he has never been infected by her in the past 5 years.   Problems:  Patient Active Problem List   Diagnosis Date Noted   Atypical chest pain 11/14/2023   Generalized anxiety disorder with panic attacks 08/01/2023   Tobacco use 08/01/2023   Prediabetes 08/01/2023   Common migraine with intractable migraine 05/30/2017   Dizziness and giddiness 05/30/2017   Subdural hematoma (HCC)    TBI (traumatic brain injury) (HCC) 06/14/2016    Allergies:  Allergies  Allergen Reactions   Penicillin G     Other Reaction(s): Other (See Comments)  Does not remember   Penicillins Hives   Zofran [Ondansetron Hcl] Other (  See Comments)    dizziness   Medications:  Current Outpatient Medications:    albuterol (VENTOLIN HFA) 108 (90 Base) MCG/ACT inhaler, Inhale 2 puffs into the lungs every 4 (four) hours as needed for wheezing or shortness of breath., Disp: 1 each, Rfl: 0   amitriptyline (ELAVIL) 25 MG tablet, TAKE 1 TABLET BY MOUTH EVERYDAY AT BEDTIME, Disp: 90 tablet, Rfl: 1   clonazePAM (KLONOPIN) 0.5 MG tablet, Take 1 tablet (0.5 mg total) by mouth 2 (two) times daily as needed for  anxiety., Disp: 30 tablet, Rfl: 1   cyclobenzaprine (FLEXERIL) 10 MG tablet, Take 1 tablet (10 mg total) by mouth 2 (two) times daily as needed for muscle spasms., Disp: 20 tablet, Rfl: 0   diclofenac (VOLTAREN) 75 MG EC tablet, Take 1 tablet (75 mg total) by mouth 2 (two) times daily., Disp: 28 tablet, Rfl: 0   meclizine (ANTIVERT) 12.5 MG tablet, Take 1 tablet (12.5 mg total) by mouth 2 (two) times daily as needed for dizziness., Disp: 30 tablet, Rfl: 1   Rimegepant Sulfate (NURTEC) 75 MG TBDP, Take 1 tablet (75 mg total) by mouth daily as needed., Disp: 8 tablet, Rfl: 6   SUMAtriptan (IMITREX) 100 MG tablet, Take 1 tablet (100 mg total) by mouth once as needed for migraine. May repeat x 1 after 2 hours; maximum 2 tabs per day and 8 tabs per month, Disp: 8 tablet, Rfl: 6  Observations/Objective: Patient is well-developed, well-nourished in no acute distress.  Resting comfortably Head is normocephalic, atraumatic.  No labored breathing. Speech is clear and coherent with logical content.  Patient is alert and oriented at baseline.  Small white papule with red border noted on tip of tongue just right of midline  Assessment and Plan: 1. Aphthous ulcer of tongue (Primary)  - Continue OTC mouth wash for canker sores - Salt water gargles - If not improved in 3 days seek in person evaluation for STI testing; he voiced understanding  Follow Up Instructions: I discussed the assessment and treatment plan with the patient. The patient was provided an opportunity to ask questions and all were answered. The patient agreed with the plan and demonstrated an understanding of the instructions.  A copy of instructions were sent to the patient via MyChart unless otherwise noted below.    The patient was advised to call back or seek an in-person evaluation if the symptoms worsen or if the condition fails to improve as anticipated.    Margaretann Loveless, PA-C

## 2024-02-20 ENCOUNTER — Emergency Department (HOSPITAL_COMMUNITY)
Admission: EM | Admit: 2024-02-20 | Discharge: 2024-02-21 | Discharge status: Against Medical Advice | Attending: Emergency Medicine | Admitting: Emergency Medicine

## 2024-02-20 ENCOUNTER — Encounter (HOSPITAL_COMMUNITY): Payer: Self-pay

## 2024-02-20 DIAGNOSIS — R55 Syncope and collapse: Secondary | ICD-10-CM | POA: Diagnosis present

## 2024-02-20 DIAGNOSIS — Z5321 Procedure and treatment not carried out due to patient leaving prior to being seen by health care provider: Secondary | ICD-10-CM | POA: Diagnosis not present

## 2024-02-20 LAB — BASIC METABOLIC PANEL WITH GFR
Anion gap: 11 (ref 5–15)
BUN: 15 mg/dL (ref 6–20)
CO2: 22 mmol/L (ref 22–32)
Calcium: 9.3 mg/dL (ref 8.9–10.3)
Chloride: 105 mmol/L (ref 98–111)
Creatinine, Ser: 0.73 mg/dL (ref 0.61–1.24)
GFR, Estimated: >60 mL/min (ref >60)
Glucose, Bld: 106 mg/dL — ABNORMAL HIGH (ref 70–99)
Potassium: 3.7 mmol/L (ref 3.5–5.1)
Sodium: 138 mmol/L (ref 135–145)

## 2024-02-20 LAB — CBC
HCT: 48.5 % (ref 39.0–52.0)
Hemoglobin: 16.6 g/dL (ref 13.0–17.0)
MCH: 29.3 pg (ref 26.0–34.0)
MCHC: 34.2 g/dL (ref 30.0–36.0)
MCV: 85.5 fL (ref 80.0–100.0)
Platelets: 273 10*3/uL (ref 150–400)
RBC: 5.67 MIL/uL (ref 4.22–5.81)
RDW: 12.2 % (ref 11.5–15.5)
WBC: 12.5 10*3/uL — ABNORMAL HIGH (ref 4.0–10.5)
nRBC: 0 % (ref 0.0–0.2)

## 2024-02-20 NOTE — ED Provider Triage Note (Signed)
 Emergency Medicine Provider Triage Evaluation Note  Daeton Kluth , a 23 y.o. male  was evaluated in triage.  Pt complains of near syncopal episode.  Patient states that he was out of his car, began to feel lightheaded and fell backwards against his car.  He did not strike his head.  Review of Systems  Positive:  Negative:   Physical Exam  BP 117/76   Pulse (!) 103   Temp 98.6 F (37 C)   Resp (!) 24   SpO2 97%  Gen:   Awake, no distress   Resp:  Normal effort  MSK:   Moves extremities without difficulty  Other:    Medical Decision Making  Medically screening exam initiated at 11:30 PM.  Appropriate orders placed.  Reymundo Winship was informed that the remainder of the evaluation will be completed by another provider, this initial triage assessment does not replace that evaluation, and the importance of remaining in the ED until their evaluation is complete.  Patient overall looks well.  Normal heart sounds.  Mild tachycardia, mild tachypnea.  EKG not yet performed.   Anders Simmonds T, DO 02/20/24 [id number]

## 2024-02-20 NOTE — ED Triage Notes (Signed)
 Brought in via EMS, dizzy spells, syncopized today Hx of dizzy spells last few years, Spells comes and goes with EMS 160/118 BP for EMS, and they rechecked it to be 140/98 Warm and diaphoretic for EMS, complaining of dull chest pain and headache that started after they stood patient up. BS=144

## 2024-02-26 ENCOUNTER — Telehealth: Payer: Self-pay

## 2024-02-26 NOTE — Transitions of Care (Post Inpatient/ED Visit) (Signed)
   02/26/2024  Name: Joshua [name] MRN: [id number] DOB: [birth year]  Today's TOC FU Call Status: Today's TOC FU Call Status:: Unsuccessful Call (1st Attempt) Unsuccessful Call (1st Attempt) Date: 02/26/24  Attempted to reach the patient regarding the most recent Inpatient/ED visit.  Follow Up Plan: Additional outreach attempts will be made to reach the patient to complete the Transitions of Care (Post Inpatient/ED visit) call.   Signature  Kirby Peoples, RMA

## 2024-02-27 IMAGING — DX DG FINGER THUMB 2+V*R*
3 series · 3 of 3 positions shown · non-contrast
Comparison: None.

CLINICAL DATA: Thumb injury

EXAM:
RIGHT THUMB 2+V

[finger ap]
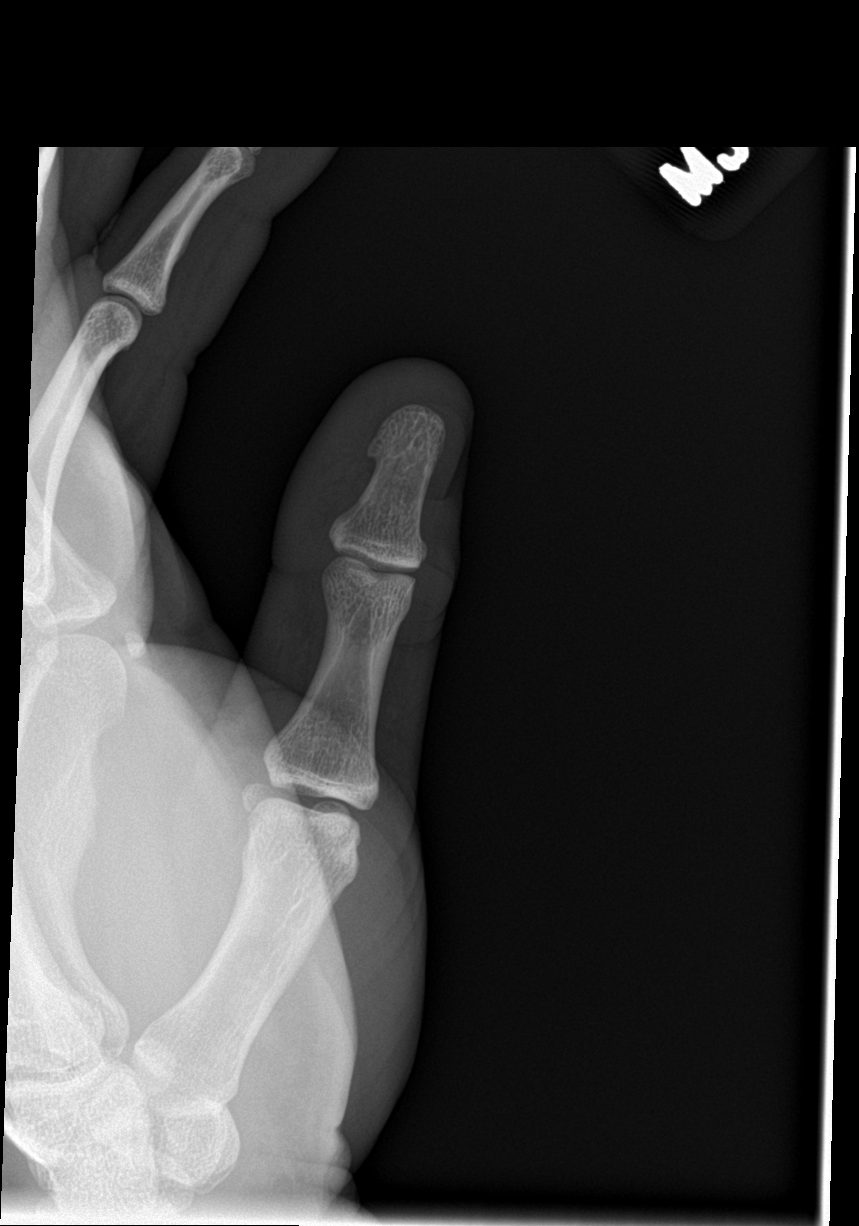

[finger obl]
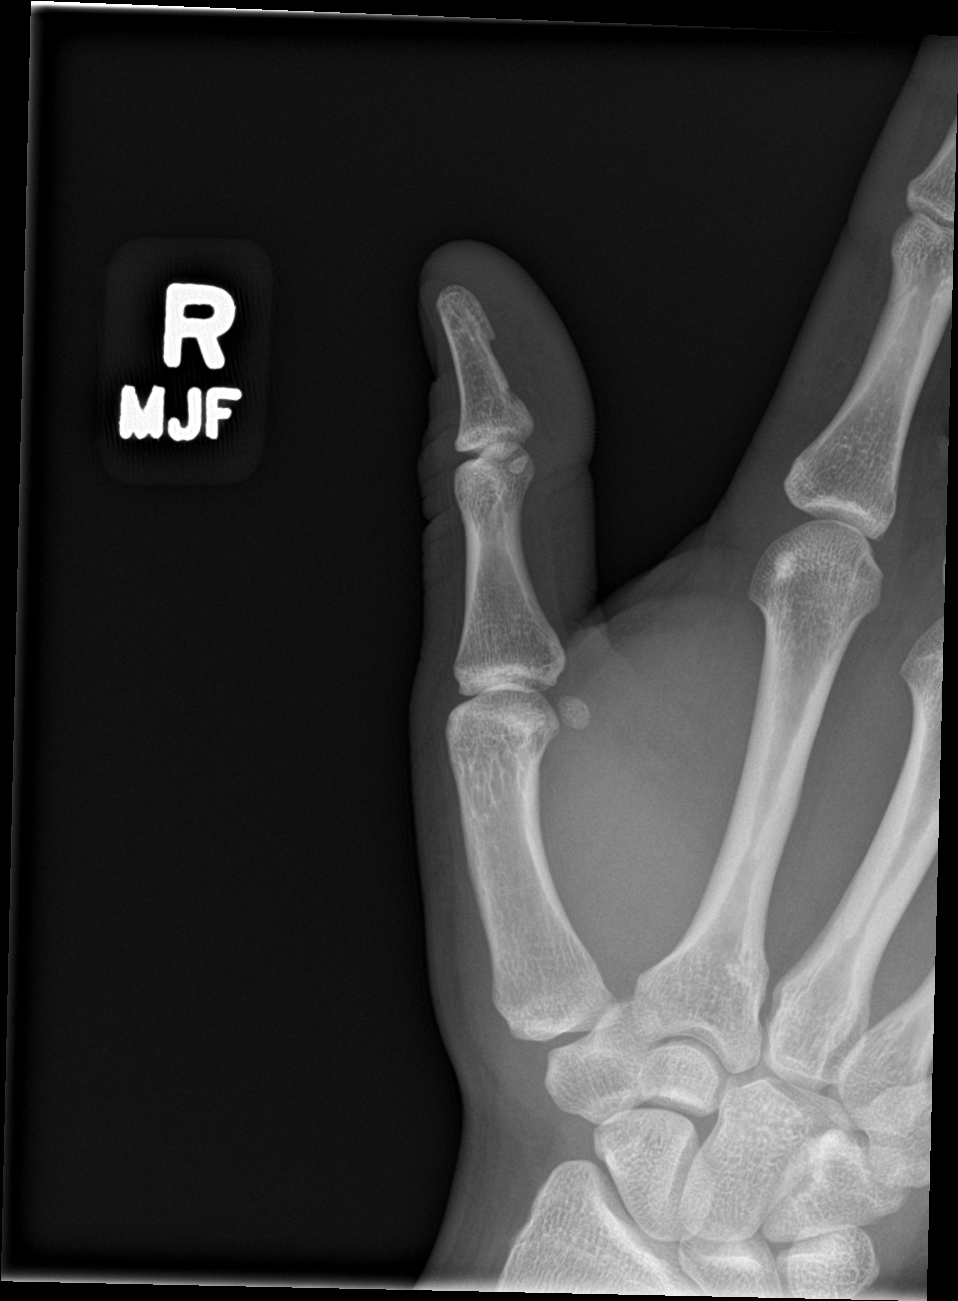

[finger lat]
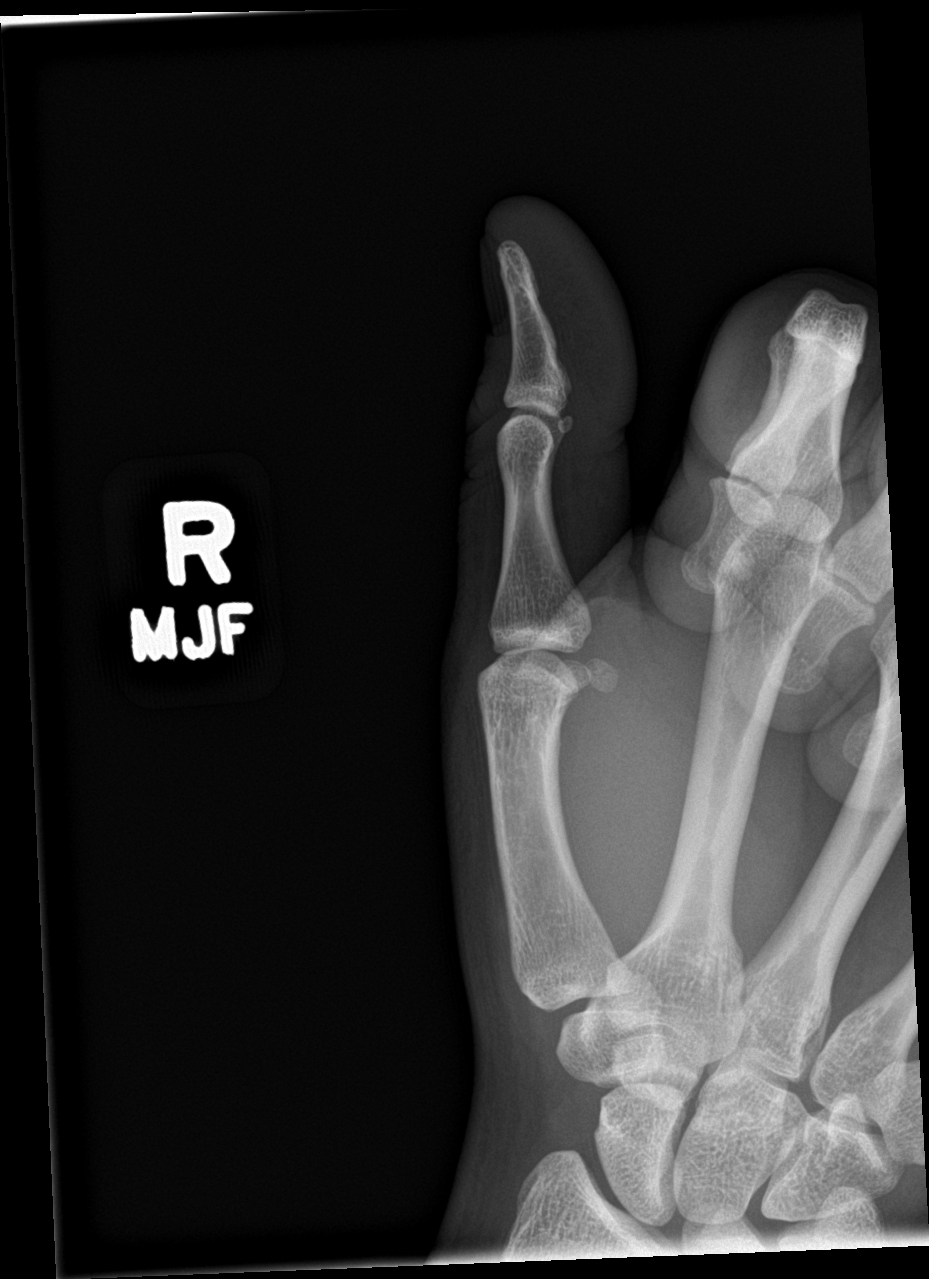

[3 of 3 positions shown; findings below may reference images not displayed]

FINDINGS: There is no evidence of fracture or dislocation. There is no
evidence of arthropathy or other focal bone abnormality. Soft
tissues are unremarkable.
IMPRESSION: Negative.

## 2024-02-27 NOTE — Transitions of Care (Post Inpatient/ED Visit) (Signed)
   02/27/2024  Name: Joshua [name] MRN: [id number] DOB: August 07, 2001  Today's TOC FU Call Status: Today's TOC FU Call Status:: Unsuccessful Call (2nd Attempt) Unsuccessful Call (1st Attempt) Date: 02/26/24 Unsuccessful Call (2nd Attempt) Date: 02/27/24  Attempted to reach the patient regarding the most recent Inpatient/ED visit.  Follow Up Plan: Additional outreach attempts will be made to reach the patient to complete the Transitions of Care (Post Inpatient/ED visit) call.   Signature  Kirby Peoples, RMA

## 2024-02-28 NOTE — Transitions of Care (Post Inpatient/ED Visit) (Signed)
   02/28/2024  Name: Joshua [name] MRN: [id number] DOB: [birth year]  Today's TOC FU Call Status: Today's TOC FU Call Status:: Unsuccessful Call (3rd Attempt) Unsuccessful Call (1st Attempt) Date: 02/26/24 Unsuccessful Call (2nd Attempt) Date: 02/27/24 Unsuccessful Call (3rd Attempt) Date: 02/28/24  Attempted to reach the patient regarding the most recent Inpatient/ED visit.  Follow Up Plan: No further outreach attempts will be made at this time. We have been unable to contact the patient.  Signature Kirby Peoples, RMA

## 2024-03-03 ENCOUNTER — Other Ambulatory Visit: Payer: Self-pay | Admitting: Family Medicine

## 2024-03-03 DIAGNOSIS — F410 Panic disorder [episodic paroxysmal anxiety] without agoraphobia: Secondary | ICD-10-CM

## 2024-03-04 NOTE — Telephone Encounter (Signed)
 Patient notified VIA phone.  He will have to call back to schedule an appointment. Dm/cma

## 2024-03-22 ENCOUNTER — Telehealth: Admitting: Physician Assistant

## 2024-03-22 ENCOUNTER — Telehealth

## 2024-03-22 DIAGNOSIS — R051 Acute cough: Secondary | ICD-10-CM

## 2024-03-22 DIAGNOSIS — J019 Acute sinusitis, unspecified: Secondary | ICD-10-CM | POA: Diagnosis not present

## 2024-03-22 DIAGNOSIS — B9689 Other specified bacterial agents as the cause of diseases classified elsewhere: Secondary | ICD-10-CM | POA: Diagnosis not present

## 2024-03-22 MED ORDER — LEVOFLOXACIN 500 MG PO TABS
500.0000 mg | ORAL_TABLET | Freq: Every day | ORAL | 0 refills | Status: AC
Start: 2024-03-22 — End: 2024-03-29

## 2024-03-22 MED ORDER — PSEUDOEPH-BROMPHEN-DM 30-2-10 MG/5ML PO SYRP: 5.0000 mL | ORAL_SOLUTION | Freq: Four times a day (QID) | ORAL | 0 refills | Status: DC | PRN

## 2024-03-22 MED ORDER — PREDNISONE 20 MG PO TABS
40.0000 mg | ORAL_TABLET | Freq: Every day | ORAL | 0 refills | Status: AC
Start: 2024-03-22 — End: ?

## 2024-03-22 NOTE — Progress Notes (Signed)
 Virtual Visit Consent   Joshua [name], you are scheduled for a virtual visit with a [facility] provider today. Just as with appointments in the office, your consent must be obtained to participate. Your consent will be active for this visit and any virtual visit you may have with one of our providers in the next 365 days. If you have a MyChart account, a copy of this consent can be sent to you electronically.  As this is a virtual visit, video technology does not allow for your provider to perform a traditional examination. This may limit your provider's ability to fully assess your condition. If your provider identifies any concerns that need to be evaluated in person or the need to arrange testing (such as labs, EKG, etc.), we will make arrangements to do so. Although advances in technology are sophisticated, we cannot ensure that it will always work on either your end or our end. If the connection with a video visit is poor, the visit may have to be switched to a telephone visit. With either a video or telephone visit, we are not always able to ensure that we have a secure connection.  By engaging in this virtual visit, you consent to the provision of healthcare and authorize for your insurance to be billed (if applicable) for the services provided during this visit. Depending on your insurance coverage, you may receive a charge related to this service.  I need to obtain your verbal consent now. Are you willing to proceed with your visit today? Cicel Pasierb has provided verbal consent on 03/22/2024 for a virtual visit (video or telephone). Angelia Kelp, PA-C  Date: 03/22/2024 2:57 PM   Virtual Visit via Video Note   I, Angelia Kelp, connected with  Joshua [name]  ([id number], 05/02/2001) on 03/22/24 at  2:45 PM EDT by a video-enabled telemedicine application and verified that I am speaking with the correct person using two identifiers.  Location: Patient: Virtual Visit Location Patient:  Home Provider: Virtual Visit Location Provider: Home Office   I discussed the limitations of evaluation and management by telemedicine and the availability of in person appointments. The patient expressed understanding and agreed to proceed.    History of Present Illness: Joshua [name] is a 23 y.o. who identifies as a male who was assigned male at birth, and is being seen today for sinus congestion and cough with chest tightness and burning.  HPI: URI  This is a new problem. The current episode started in the past 7 days. The problem has been gradually worsening. There has been no fever. Associated symptoms include chest pain (tightness and burning), congestion, coughing, headaches, nausea (with coughing), a plugged ear sensation (with blowing nose), rhinorrhea, sinus pain, a sore throat and wheezing. Pertinent negatives include no diarrhea, ear pain or vomiting. Associated symptoms comments: Gets dizziness with blowing nose. Treatments tried: breathing treatment at home, dayquil, nyquil, flonase; Did take some of his fiance's Cefdinir [not helping] and Promethazine DM cough syrup. The treatment provided no relief.      Problems:  Patient Active Problem List   Diagnosis Date Noted   Atypical chest pain 11/14/2023   Generalized anxiety disorder with panic attacks 08/01/2023   Tobacco use 08/01/2023   Prediabetes 08/01/2023   Common migraine with intractable migraine 05/30/2017   Dizziness and giddiness 05/30/2017   Subdural hematoma (HCC)    TBI (traumatic brain injury) (HCC) 06/14/2016    Allergies:  Allergies  Allergen Reactions   Penicillin G  Other Reaction(s): Other (See Comments)  Does not remember   Penicillins Hives   Zofran  [Ondansetron  Hcl] Other (See Comments)    dizziness   Medications:  Current Outpatient Medications:    albuterol  (VENTOLIN  HFA) 108 (90 Base) MCG/ACT inhaler, Inhale 2 puffs into the lungs every 4 (four) hours as needed for wheezing or shortness of  breath., Disp: 1 each, Rfl: 0   amitriptyline  (ELAVIL ) 25 MG tablet, TAKE 1 TABLET BY MOUTH EVERYDAY AT BEDTIME, Disp: 90 tablet, Rfl: 1   brompheniramine-pseudoephedrine-DM 30-2-10 MG/5ML syrup, Take 5 mLs by mouth 4 (four) times daily as needed., Disp: 120 mL, Rfl: 0   clonazePAM  (KLONOPIN ) 0.5 MG tablet, TAKE 1 TABLET BY MOUTH 2 TIMES DAILY AS NEEDED FOR ANXIETY., Disp: 30 tablet, Rfl: 0   cyclobenzaprine  (FLEXERIL ) 10 MG tablet, Take 1 tablet (10 mg total) by mouth 2 (two) times daily as needed for muscle spasms., Disp: 20 tablet, Rfl: 0   diclofenac  (VOLTAREN ) 75 MG EC tablet, Take 1 tablet (75 mg total) by mouth 2 (two) times daily., Disp: 28 tablet, Rfl: 0   levofloxacin (LEVAQUIN) 500 MG tablet, Take 1 tablet (500 mg total) by mouth daily for 7 days., Disp: 7 tablet, Rfl: 0   meclizine  (ANTIVERT ) 12.5 MG tablet, Take 1 tablet (12.5 mg total) by mouth 2 (two) times daily as needed for dizziness., Disp: 30 tablet, Rfl: 1   predniSONE  (DELTASONE ) 20 MG tablet, Take 2 tablets (40 mg total) by mouth daily with breakfast., Disp: 14 tablet, Rfl: 0   Rimegepant Sulfate (NURTEC) 75 MG TBDP, Take 1 tablet (75 mg total) by mouth daily as needed., Disp: 8 tablet, Rfl: 6   SUMAtriptan  (IMITREX ) 100 MG tablet, Take 1 tablet (100 mg total) by mouth once as needed for migraine. May repeat x 1 after 2 hours; maximum 2 tabs per day and 8 tabs per month, Disp: 8 tablet, Rfl: 6  Observations/Objective: Patient is well-developed, well-nourished in no acute distress.  Resting comfortably at home.  Head is normocephalic, atraumatic.  No labored breathing.  Speech is clear and coherent with logical content.  Patient is alert and oriented at baseline.    Assessment and Plan: 1. Acute bacterial sinusitis (Primary) - levofloxacin (LEVAQUIN) 500 MG tablet; Take 1 tablet (500 mg total) by mouth daily for 7 days.  Dispense: 7 tablet; Refill: 0  2. Acute cough - predniSONE  (DELTASONE ) 20 MG tablet; Take 2 tablets  (40 mg total) by mouth daily with breakfast.  Dispense: 14 tablet; Refill: 0 - brompheniramine-pseudoephedrine-DM 30-2-10 MG/5ML syrup; Take 5 mLs by mouth 4 (four) times daily as needed.  Dispense: 120 mL; Refill: 0 - levofloxacin (LEVAQUIN) 500 MG tablet; Take 1 tablet (500 mg total) by mouth daily for 7 days.  Dispense: 7 tablet; Refill: 0  - Worsening symptoms that have not responded to OTC medications.  - Will give Levaquin - Prednisone  for chest tightness, chest burning, wheezing - Bromfed DM for cough - Continue allergy medications.  - Steam and humidifier can help - Stay well hydrated and get plenty of rest.  - Seek in person evaluation if no symptom improvement or if symptoms worsen   Follow Up Instructions: I discussed the assessment and treatment plan with the patient. The patient was provided an opportunity to ask questions and all were answered. The patient agreed with the plan and demonstrated an understanding of the instructions.  A copy of instructions were sent to the patient via MyChart unless otherwise noted below.  The patient was advised to call back or seek an in-person evaluation if the symptoms worsen or if the condition fails to improve as anticipated.    Angelia Kelp, PA-C

## 2024-03-22 NOTE — Patient Instructions (Signed)
 Karlis Overland, thank you for joining Angelia Kelp, PA-C for today's virtual visit.  While this provider is not your primary care provider (PCP), if your PCP is located in our provider database this encounter information will be shared with them immediately following your visit.   A [facility] MyChart account gives you access to today's visit and all your visits, tests, and labs performed at [facility] " click here if you don't have a [facility] MyChart account or go to [email or web address]  Consent: (Patient) Datavious Kyger provided verbal consent for this virtual visit at the beginning of the encounter.  Current Medications:  Current Outpatient Medications:    albuterol  (VENTOLIN  HFA) 108 (90 Base) MCG/ACT inhaler, Inhale 2 puffs into the lungs every 4 (four) hours as needed for wheezing or shortness of breath., Disp: 1 each, Rfl: 0   amitriptyline  (ELAVIL ) 25 MG tablet, TAKE 1 TABLET BY MOUTH EVERYDAY AT BEDTIME, Disp: 90 tablet, Rfl: 1   brompheniramine-pseudoephedrine-DM 30-2-10 MG/5ML syrup, Take 5 mLs by mouth 4 (four) times daily as needed., Disp: 120 mL, Rfl: 0   clonazePAM  (KLONOPIN ) 0.5 MG tablet, TAKE 1 TABLET BY MOUTH 2 TIMES DAILY AS NEEDED FOR ANXIETY., Disp: 30 tablet, Rfl: 0   cyclobenzaprine  (FLEXERIL ) 10 MG tablet, Take 1 tablet (10 mg total) by mouth 2 (two) times daily as needed for muscle spasms., Disp: 20 tablet, Rfl: 0   diclofenac  (VOLTAREN ) 75 MG EC tablet, Take 1 tablet (75 mg total) by mouth 2 (two) times daily., Disp: 28 tablet, Rfl: 0   levofloxacin (LEVAQUIN) 500 MG tablet, Take 1 tablet (500 mg total) by mouth daily for 7 days., Disp: 7 tablet, Rfl: 0   meclizine  (ANTIVERT ) 12.5 MG tablet, Take 1 tablet (12.5 mg total) by mouth 2 (two) times daily as needed for dizziness., Disp: 30 tablet, Rfl: 1   predniSONE  (DELTASONE ) 20 MG tablet, Take 2 tablets (40 mg total) by mouth daily with breakfast., Disp: 14 tablet, Rfl: 0   Rimegepant Sulfate  (NURTEC) 75 MG TBDP, Take 1 tablet (75 mg total) by mouth daily as needed., Disp: 8 tablet, Rfl: 6   SUMAtriptan  (IMITREX ) 100 MG tablet, Take 1 tablet (100 mg total) by mouth once as needed for migraine. May repeat x 1 after 2 hours; maximum 2 tabs per day and 8 tabs per month, Disp: 8 tablet, Rfl: 6   Medications ordered in this encounter:  Meds ordered this encounter  Medications   predniSONE  (DELTASONE ) 20 MG tablet    Sig: Take 2 tablets (40 mg total) by mouth daily with breakfast.    Dispense:  14 tablet    Refill:  0    Supervising Provider:   LAMPTEY, PHILIP O [[id number]]   brompheniramine-pseudoephedrine-DM 30-2-10 MG/5ML syrup    Sig: Take 5 mLs by mouth 4 (four) times daily as needed.    Dispense:  120 mL    Refill:  0    Supervising Provider:   Corine Dice [[id number]]   levofloxacin (LEVAQUIN) 500 MG tablet    Sig: Take 1 tablet (500 mg total) by mouth daily for 7 days.    Dispense:  7 tablet    Refill:  0    Supervising Provider:   Corine Dice [[id number]]     *If you need refills on other medications prior to your next appointment, please contact your pharmacy*  Follow-Up: Call back or seek an in-person evaluation if the symptoms worsen or if the condition fails to improve as  anticipated.  [facility] [phone]  Other Instructions  Acute Bronchitis, Adult  Acute bronchitis is sudden inflammation of the main airways (bronchi) that come off the windpipe (trachea) in the lungs. The swelling causes the airways to get smaller and make more mucus than normal. This can make it hard to breathe and can cause coughing or noisy breathing (wheezing). Acute bronchitis may last several weeks. The cough may last longer. Allergies, asthma, and exposure to smoke may make the condition worse. What are the causes? This condition can be caused by germs and by substances that irritate the lungs, including: Cold and flu viruses. The most common cause of this  condition is the virus that causes the common cold. Bacteria. This is less common. Breathing in substances that irritate the lungs, including: Smoke from cigarettes and other forms of tobacco. Dust and pollen. Fumes from household cleaning products, gases, or burned fuel. Indoor or outdoor air pollution. What increases the risk? The following factors may make you more likely to develop this condition: A weak body's defense system, also called the immune system. A condition that affects your lungs and breathing, such as asthma. What are the signs or symptoms? Common symptoms of this condition include: Coughing. This may bring up clear, yellow, or green mucus from your lungs (sputum). Wheezing. Runny or stuffy nose. Having too much mucus in your lungs (chest congestion). Shortness of breath. Aches and pains, including sore throat or chest. How is this diagnosed? This condition is usually diagnosed based on: Your symptoms and medical history. A physical exam. You may also have other tests, including tests to rule out other conditions, such as pneumonia. These tests include: A test of lung function. Test of a mucus sample to look for the presence of bacteria. Tests to check the oxygen level in your blood. Blood tests. Chest X-ray. How is this treated? Most cases of acute bronchitis clear up over time without treatment. Your health care provider may recommend: Drinking more fluids to help thin your mucus so it is easier to cough up. Taking inhaled medicine (inhaler) to improve air flow in and out of your lungs. Using a vaporizer or a humidifier. These are machines that add water to the air to help you breathe better. Taking a medicine that thins mucus and clears congestion (expectorant). Taking a medicine that prevents or stops coughing (cough suppressant). It is not common to take an antibiotic medicine for this condition. Follow these instructions at home:  Take over-the-counter  and prescription medicines only as told by your health care provider. Use an inhaler, vaporizer, or humidifier as told by your health care provider. Take two teaspoons (10 mL) of honey at bedtime to lessen coughing at night. Drink enough fluid to keep your urine pale yellow. Do not use any products that contain nicotine or tobacco. These products include cigarettes, chewing tobacco, and vaping devices, such as e-cigarettes. If you need help quitting, ask your health care provider. Get plenty of rest. Return to your normal activities as told by your health care provider. Ask your health care provider what activities are safe for you. Keep all follow-up visits. This is important. How is this prevented? To lower your risk of getting this condition again: Wash your hands often with soap and water for at least 20 seconds. If soap and water are not available, use hand sanitizer. Avoid contact with people who have cold symptoms. Try not to touch your mouth, nose, or eyes with your hands. Avoid  breathing in smoke or chemical fumes. Breathing smoke or chemical fumes will make your condition worse. Get the flu shot every year. Contact a health care provider if: Your symptoms do not improve after 2 weeks. You have trouble coughing up the mucus. Your cough keeps you awake at night. You have a fever. Get help right away if you: Cough up blood. Feel pain in your chest. Have severe shortness of breath. Faint or keep feeling like you are going to faint. Have a severe headache. Have a fever or chills that get worse. These symptoms may represent a serious problem that is an emergency. Do not wait to see if the symptoms will go away. Get medical help right away. Call your local emergency services (911 in the [place] Do not drive yourself to the hospital. Summary Acute bronchitis is inflammation of the main airways (bronchi) that come off the windpipe (trachea) in the lungs. The swelling causes the airways  to get smaller and make more mucus than normal. Drinking more fluids can help thin your mucus so it is easier to cough up. Take over-the-counter and prescription medicines only as told by your health care provider. Do not use any products that contain nicotine or tobacco. These products include cigarettes, chewing tobacco, and vaping devices, such as e-cigarettes. If you need help quitting, ask your health care provider. Contact a health care provider if your symptoms do not improve after 2 weeks. This information is not intended to replace advice given to you by your health care provider. Make sure you discuss any questions you have with your health care provider. Document Revised: 02/10/2022 Document Reviewed: 03/03/2021 Elsevier Patient Education  2024 Else[employer] Inc.  Sinus Infection, Adult A sinus infection, also called sinusitis, is inflammation of your sinuses. Sinuses are hollow spaces in the bones around your face. Your sinuses are located: Around your eyes. In the middle of your forehead. Behind your nose. In your cheekbones. Mucus normally drains out of your sinuses. When your nasal tissues become inflamed or swollen, mucus can become trapped or blocked. This allows bacteria, viruses, and fungi to grow, which leads to infection. Most infections of the sinuses are caused by a virus. A sinus infection can develop quickly. It can last for up to 4 weeks (acute) or for more than 12 weeks (chronic). A sinus infection often develops after a cold. What are the causes? This condition is caused by anything that creates swelling in the sinuses or stops mucus from draining. This includes: Allergies. Asthma. Infection from bacteria or viruses. Deformities or blockages in your nose or sinuses. Abnormal growths in the nose (nasal polyps). Pollutants, such as chemicals or irritants in the air. Infection from fungi. This is rare. What increases the risk? You are more likely to develop this  condition if you: Have a weak body defense system (immune system). Do a lot of swimming or diving. Overuse nasal sprays. Smoke. What are the signs or symptoms? The main symptoms of this condition are pain and a feeling of pressure around the affected sinuses. Other symptoms include: Stuffy nose or congestion that makes it difficult to breathe through your nose. Thick yellow or greenish drainage from your nose. Tenderness, swelling, and warmth over the affected sinuses. A cough that may get worse at night. Decreased sense of smell and taste. Extra mucus that collects in the throat or the back of the nose (postnasal drip) causing a sore throat or bad breath. Tiredness (fatigue). Fever. How is this diagnosed? This condition is  diagnosed based on: Your symptoms. Your medical history. A physical exam. Tests to find out if your condition is acute or chronic. This may include: Checking your nose for nasal polyps. Viewing your sinuses using a device that has a light (endoscope). Testing for allergies or bacteria. Imaging tests, such as an MRI or CT scan. In rare cases, a bone biopsy may be done to rule out more serious types of fungal sinus disease. How is this treated? Treatment for a sinus infection depends on the cause and whether your condition is chronic or acute. If caused by a virus, your symptoms should go away on their own within 10 days. You may be given medicines to relieve symptoms. They include: Medicines that shrink swollen nasal passages (decongestants). A spray that eases inflammation of the nostrils (topical intranasal corticosteroids). Rinses that help get rid of thick mucus in your nose (nasal saline washes). Medicines that treat allergies (antihistamines). Over-the-counter pain relievers. If caused by bacteria, your health care provider may recommend waiting to see if your symptoms improve. Most bacterial infections will get better without antibiotic medicine. You may be  given antibiotics if you have: A severe infection. A weak immune system. If caused by narrow nasal passages or nasal polyps, surgery may be needed. Follow these instructions at home: Medicines Take, use, or apply over-the-counter and prescription medicines only as told by your health care provider. These may include nasal sprays. If you were prescribed an antibiotic medicine, take it as told by your health care provider. Do not stop taking the antibiotic even if you start to feel better. Hydrate and humidify  Drink enough fluid to keep your urine pale yellow. Staying hydrated will help to thin your mucus. Use a cool mist humidifier to keep the humidity level in your home above 50%. Inhale steam for 10-15 minutes, 3-4 times a day, or as told by your health care provider. You can do this in the bathroom while a hot shower is running. Limit your exposure to cool or dry air. Rest Rest as much as possible. Sleep with your head raised (elevated). Make sure you get enough sleep each night. General instructions  Apply a warm, moist washcloth to your face 3-4 times a day or as told by your health care provider. This will help with discomfort. Use nasal saline washes as often as told by your health care provider. Wash your hands often with soap and water to reduce your exposure to germs. If soap and water are not available, use hand sanitizer. Do not smoke. Avoid being around people who are smoking (secondhand smoke). Keep all follow-up visits. This is important. Contact a health care provider if: You have a fever. Your symptoms get worse. Your symptoms do not improve within 10 days. Get help right away if: You have a severe headache. You have persistent vomiting. You have severe pain or swelling around your face or eyes. You have vision problems. You develop confusion. Your neck is stiff. You have trouble breathing. These symptoms may be an emergency. Get help right away. Call 911. Do  not wait to see if the symptoms will go away. Do not drive yourself to the hospital. Summary A sinus infection is soreness and inflammation of your sinuses. Sinuses are hollow spaces in the bones around your face. This condition is caused by nasal tissues that become inflamed or swollen. The swelling traps or blocks the flow of mucus. This allows bacteria, viruses, and fungi to grow, which leads to infection. If you  were prescribed an antibiotic medicine, take it as told by your health care provider. Do not stop taking the antibiotic even if you start to feel better. Keep all follow-up visits. This is important. This information is not intended to replace advice given to you by your health care provider. Make sure you discuss any questions you have with your health care provider. Document Revised: 10/05/2021 Document Reviewed: 10/05/2021 Elsevier Patient Education  2024 Elsevier Inc.   If you have been instructed to have an in-person evaluation today at a local Urgent Care facility, please use the link below. It will take you to a list of all of our available [facility], including address, phone number and hours of operation. Please do not delay care.  [facility]  If you or a family member do not have a primary care provider, use the link below to schedule a visit and establish care. When you choose a [facility] primary care physician or advanced practice provider, you gain a long-term partner in health. Find a Primary Care Provider  Learn more about [facility] in-office and virtual care options: [facility] Get Care Now

## 2024-04-22 ENCOUNTER — Telehealth: Admitting: Physician Assistant

## 2024-04-22 DIAGNOSIS — R091 Pleurisy: Secondary | ICD-10-CM

## 2024-04-22 MED ORDER — MELOXICAM 15 MG PO TABS: 15.0000 mg | ORAL_TABLET | Freq: Every day | ORAL | 0 refills | Status: DC

## 2024-04-22 NOTE — Progress Notes (Signed)
 Virtual Visit Consent   Joshua [name], you are scheduled for a virtual visit with a [facility] provider today. Just as with appointments in the office, your consent must be obtained to participate. Your consent will be active for this visit and any virtual visit you may have with one of our providers in the next 365 days. If you have a MyChart account, a copy of this consent can be sent to you electronically.  As this is a virtual visit, video technology does not allow for your provider to perform a traditional examination. This may limit your provider's ability to fully assess your condition. If your provider identifies any concerns that need to be evaluated in person or the need to arrange testing (such as labs, EKG, etc.), we will make arrangements to do so. Although advances in technology are sophisticated, we cannot ensure that it will always work on either your end or our end. If the connection with a video visit is poor, the visit may have to be switched to a telephone visit. With either a video or telephone visit, we are not always able to ensure that we have a secure connection.  By engaging in this virtual visit, you consent to the provision of healthcare and authorize for your insurance to be billed (if applicable) for the services provided during this visit. Depending on your insurance coverage, you may receive a charge related to this service.  I need to obtain your verbal consent now. Are you willing to proceed with your visit today? Avian Konigsberg has provided verbal consent on 04/22/2024 for a virtual visit (video or telephone). Angelia Kelp, PA-C  Date: 04/22/2024 10:33 AM   Virtual Visit via Video Note   I, Angelia Kelp, connected with  Joshua [name]  ([id number], 01-22-2001) on 04/22/24 at 10:15 AM EDT by a video-enabled telemedicine application and verified that I am speaking with the correct person using two identifiers.  Location: Patient: Virtual Visit Location  Patient: Home Provider: Virtual Visit Location Provider: Home Office   I discussed the limitations of evaluation and management by telemedicine and the availability of in person appointments. The patient expressed understanding and agreed to proceed.    History of Present Illness: Joshua [name] is a 23 y.o. who identifies as a male who was assigned male at birth, and is being seen today for left side chest pain with deep breathing.  HPI: Chest Pain  This is a new problem. The current episode started in the past 7 days (3-4 days; uses a CPAP and had increased his pressure from 3-4 to 9-12 on his own. He slept better with the higher pressure, but woke up a few nights ago with his lungs hurting). The onset quality is sudden. The problem occurs 2 to 4 times per day. The problem has been unchanged. The pain is present in the lateral region (left lung). The pain is mild. The quality of the pain is described as burning. The pain radiates to the left shoulder. Associated symptoms include a cough (chronic). Pertinent negatives include no back pain, dizziness, exertional chest pressure, irregular heartbeat, malaise/fatigue, nausea, palpitations, shortness of breath or vomiting. The cough's precipitants include smoke. The cough is Non-productive. There is no color change associated with the cough. Nothing relieves the cough. Nothing worsens the cough. The pain is aggravated by deep breathing and movement. He has tried rest for the symptoms. The treatment provided no relief. Risk factors include smoking/tobacco exposure.  His past medical history is significant for  anxiety/panic attacks and sleep apnea.  Pertinent negatives for past medical history include no recent injury and no spontaneous pneumothorax.     Problems:  Patient Active Problem List   Diagnosis Date Noted   Atypical chest pain 11/14/2023   Generalized anxiety disorder with panic attacks 08/01/2023   Tobacco use 08/01/2023   Prediabetes  08/01/2023   Common migraine with intractable migraine 05/30/2017   Dizziness and giddiness 05/30/2017   Subdural hematoma (HCC)    TBI (traumatic brain injury) (HCC) 06/14/2016    Allergies:  Allergies  Allergen Reactions   Penicillin G     Other Reaction(s): Other (See Comments)  Does not remember   Penicillins Hives   Zofran  [Ondansetron  Hcl] Other (See Comments)    dizziness   Medications:  Current Outpatient Medications:    meloxicam (MOBIC) 15 MG tablet, Take 1 tablet (15 mg total) by mouth daily., Disp: 30 tablet, Rfl: 0   albuterol  (VENTOLIN  HFA) 108 (90 Base) MCG/ACT inhaler, Inhale 2 puffs into the lungs every 4 (four) hours as needed for wheezing or shortness of breath., Disp: 1 each, Rfl: 0   amitriptyline  (ELAVIL ) 25 MG tablet, TAKE 1 TABLET BY MOUTH EVERYDAY AT BEDTIME, Disp: 90 tablet, Rfl: 1   brompheniramine-pseudoephedrine-DM 30-2-10 MG/5ML syrup, Take 5 mLs by mouth 4 (four) times daily as needed., Disp: 120 mL, Rfl: 0   clonazePAM  (KLONOPIN ) 0.5 MG tablet, TAKE 1 TABLET BY MOUTH 2 TIMES DAILY AS NEEDED FOR ANXIETY., Disp: 30 tablet, Rfl: 0   cyclobenzaprine  (FLEXERIL ) 10 MG tablet, Take 1 tablet (10 mg total) by mouth 2 (two) times daily as needed for muscle spasms., Disp: 20 tablet, Rfl: 0   meclizine  (ANTIVERT ) 12.5 MG tablet, Take 1 tablet (12.5 mg total) by mouth 2 (two) times daily as needed for dizziness., Disp: 30 tablet, Rfl: 1   predniSONE  (DELTASONE ) 20 MG tablet, Take 2 tablets (40 mg total) by mouth daily with breakfast., Disp: 14 tablet, Rfl: 0   Rimegepant Sulfate (NURTEC) 75 MG TBDP, Take 1 tablet (75 mg total) by mouth daily as needed., Disp: 8 tablet, Rfl: 6   SUMAtriptan  (IMITREX ) 100 MG tablet, Take 1 tablet (100 mg total) by mouth once as needed for migraine. May repeat x 1 after 2 hours; maximum 2 tabs per day and 8 tabs per month, Disp: 8 tablet, Rfl: 6  Observations/Objective: Patient is well-developed, well-nourished in no acute distress.   Resting comfortably at home.  Head is normocephalic, atraumatic.  No labored breathing.  Speech is clear and coherent with logical content.  Patient is alert and oriented at baseline.    Assessment and Plan: 1. Pleurisy without effusion (Primary) - meloxicam (MOBIC) 15 MG tablet; Take 1 tablet (15 mg total) by mouth daily.  Dispense: 30 tablet; Refill: 0  - History of Pleurisy in Dec 2024, feels similar - Meloxicam added - Tylenol  okay to take with Meloxicam if needed - Avoid other NSAIDs (Ibuprofen , Naproxen , Goody's) - Can use heating pad if needed - Discussed decreasing CPAP pressure as this may be source - Strict ER precautions if develops shortness of breath or difficulty breathing - Seek in person evaluation if not resolving in 2-4 weeks   Follow Up Instructions: I discussed the assessment and treatment plan with the patient. The patient was provided an opportunity to ask questions and all were answered. The patient agreed with the plan and demonstrated an understanding of the instructions.  A copy of instructions were sent to the patient via MyChart  unless otherwise noted below.    The patient was advised to call back or seek an in-person evaluation if the symptoms worsen or if the condition fails to improve as anticipated.    Angelia Kelp, PA-C

## 2024-04-22 NOTE — Patient Instructions (Signed)
 Joshua [name], thank you for joining Angelia Kelp, PA-C for today's virtual visit.  While this provider is not your primary care provider (PCP), if your PCP is located in our provider database this encounter information will be shared with them immediately following your visit.   A [facility] MyChart account gives you access to today's visit and all your visits, tests, and labs performed at [facility] " click here if you don't have a [facility] MyChart account or go to [email or web address]  Consent: (Patient) Joshua [name] provided verbal consent for this virtual visit at the beginning of the encounter.  Current Medications:  Current Outpatient Medications:    meloxicam (MOBIC) 15 MG tablet, Take 1 tablet (15 mg total) by mouth daily., Disp: 30 tablet, Rfl: 0   albuterol  (VENTOLIN  HFA) 108 (90 Base) MCG/ACT inhaler, Inhale 2 puffs into the lungs every 4 (four) hours as needed for wheezing or shortness of breath., Disp: 1 each, Rfl: 0   amitriptyline  (ELAVIL ) 25 MG tablet, TAKE 1 TABLET BY MOUTH EVERYDAY AT BEDTIME, Disp: 90 tablet, Rfl: 1   brompheniramine-pseudoephedrine-DM 30-2-10 MG/5ML syrup, Take 5 mLs by mouth 4 (four) times daily as needed., Disp: 120 mL, Rfl: 0   clonazePAM  (KLONOPIN ) 0.5 MG tablet, TAKE 1 TABLET BY MOUTH 2 TIMES DAILY AS NEEDED FOR ANXIETY., Disp: 30 tablet, Rfl: 0   cyclobenzaprine  (FLEXERIL ) 10 MG tablet, Take 1 tablet (10 mg total) by mouth 2 (two) times daily as needed for muscle spasms., Disp: 20 tablet, Rfl: 0   meclizine  (ANTIVERT ) 12.5 MG tablet, Take 1 tablet (12.5 mg total) by mouth 2 (two) times daily as needed for dizziness., Disp: 30 tablet, Rfl: 1   predniSONE  (DELTASONE ) 20 MG tablet, Take 2 tablets (40 mg total) by mouth daily with breakfast., Disp: 14 tablet, Rfl: 0   Rimegepant Sulfate (NURTEC) 75 MG TBDP, Take 1 tablet (75 mg total) by mouth daily as needed., Disp: 8 tablet, Rfl: 6   SUMAtriptan  (IMITREX ) 100 MG tablet, Take 1  tablet (100 mg total) by mouth once as needed for migraine. May repeat x 1 after 2 hours; maximum 2 tabs per day and 8 tabs per month, Disp: 8 tablet, Rfl: 6   Medications ordered in this encounter:  Meds ordered this encounter  Medications   meloxicam (MOBIC) 15 MG tablet    Sig: Take 1 tablet (15 mg total) by mouth daily.    Dispense:  30 tablet    Refill:  0    Supervising Provider:   LAMPTEY, PHILIP O [[id number]]     *If you need refills on other medications prior to your next appointment, please contact your pharmacy*  Follow-Up: Call back or seek an in-person evaluation if the symptoms worsen or if the condition fails to improve as anticipated.  [facility] [phone]  Other Instructions Pleurisy  Pleurisy, or pleuritis, is irritation and inflammation of the outer lining of the lungs (pleura). The pleura has two layers that cover both the outside of the lung and the inside of the chest wall. Most often, there is a small amount of fluid (pleural fluid) between the layers of the pleura. The fluid allows the lungs to move in and out smoothly when you breathe. Pleurisy can cause the layers of the pleura to be rough and dry and to rub together. This can make it hard to breathe or cough. Sometimes, pleurisy can be linked to pleural effusion. This is when fluid builds up between the layers.  What are the causes? Common causes of this condition include: A lung infection. This may be caused by bacteria or a virus. Pulmonary embolism. This is a blood clot that travels to the lungs. Pneumothorax. This is when air leaks into the pleural space. It can happen after an injury or trauma to the chest. Heart or chest surgery. A lung reaction to certain medicines or treatments. These include treatments to kill cancer cells, such as chemotherapy or radiation therapy to the chest. Conditions that can cause inflammation in the lungs. These include rheumatoid arthritis, lupus, sickle  cell disease, inflammatory bowel disease, pancreatitis, lung cancer, and lung tumor. In some cases, the cause of this condition is not known. What are the signs or symptoms? The main symptom of this condition is chest pain. The pain may: Be on one side of your body. Be sharp and stabbing. Spread to your back or shoulder. It may get worse when you cough, take deep breaths, or make sudden movements. Other symptoms may include: Shortness of breath. Noisy breathing. This may include making high-pitched whistling sounds when you breathe out (wheezing). Cough. Fever. Coughing up blood (hemoptysis) or mucus from your lungs (sputum). Symptoms may get worse when you lie down or lie on one side. Your symptoms may be similar to those of a heart attack or inflammation of the heart (pericarditis). How is this diagnosed? This condition may be diagnosed based on your medical history and symptoms. Your health care provider will check if you have heart or lung disease. They may also do a physical exam. During the exam, they will listen to your breathing to check for a rough, rubbing sound (friction rub) when you breathe. You may also have tests, such as: Blood tests. These can be done to check for infections or diseases. They can also measure the amount of oxygen in your blood. An electrocardiogram (ECG). This looks at your heart rhythm. Imaging tests of your lungs. These may include a chest X-ray, ultrasound, MRI, or CT scan. Thoracentesis. This is when a needle is used to remove pleural fluid for testing. How is this treated? Treatment for pleurisy depends on the cause. If it was caused by a virus, it may clear up on its own within 2 weeks. Treatment for pleurisy may include: NSAIDs, such as ibuprofen . These can help relieve pain and inflammation. Antibiotics. You may need these if your condition was caused by bacteria. Prescription pain medicine or cough medicine. Blood thinners (anticoagulants). These  can treat blood clots if your condition was caused by pulmonary embolism. Removal of pleural fluid or air. This can be done using a chest tube to vacuum fluid or air from the pleural space. Follow these instructions at home: Medicines Take over-the-counter and prescription medicines only as told by your provider. If you were prescribed antibiotics, take them as told by your provider. Do not stop using the antibiotic even if you start to feel better. If you were prescribed medicines to remove extra fluid from your lungs (diuretics), take them as told by your provider. If you are taking blood thinners: Talk with your provider before you take any medicines that contain aspirin or NSAIDs. These medicines increase your risk for dangerous bleeding. Take your medicine exactly as told, at the same time every day. Avoid activities that could cause injury or bruising. Follow instructions about how to prevent falls. Wear a medical alert bracelet or carry a card that lists what medicines you take. Ask your provider if the medicine prescribed to  you requires you to avoid driving or using machinery. Activity Rest as told by your provider. Return to your normal activities as told by your provider. Ask your provider what activities are safe for you. General instructions Watch for changes in your condition. Take deep breaths often, even if it is painful. This can help prevent lung infection (pneumonia) and collapse of lung tissue (atelectasis). You may be given a device called an incentive spirometer to help you breathe and exercise your lungs. Do not use any products that contain nicotine or tobacco. These products include cigarettes, chewing tobacco, and vaping devices, such as e-cigarettes. If you need help quitting, ask your provider. Contact a health care provider if: You have pain that gets worse or happens more often. Your pain does not get better with medicine. You have a fever or chills. Your cough  or shortness of breath does not get better. You cough up pus-like (purulent) fluid. You cough up blood. Get help right away if: You have trouble breathing with activity, and it gets worse. You have shortness of breath or wheezing that gets worse. You have pain that spreads into your neck, arms, or jaw. You feel dizzy or faint. These symptoms may be an emergency. Get help right away. Call 911. Do not wait to see if the symptoms will go away. Do not drive yourself to the hospital. This information is not intended to replace advice given to you by your health care provider. Make sure you discuss any questions you have with your health care provider. Document Revised: 06/10/2022 Document Reviewed: 06/10/2022 Elsevier Patient Education  2024 [employer]   If you have been instructed to have an in-person evaluation today at a local Urgent Care facility, please use the link below. It will take you to a list of all of our available [facility], including address, phone number and hours of operation. Please do not delay care.  [facility]  If you or a family member do not have a primary care provider, use the link below to schedule a visit and establish care. When you choose a [facility] primary care physician or advanced practice provider, you gain a long-term partner in health. Find a Primary Care Provider  Learn more about [facility]'s in-office and virtual care options: [facility] - Get Care Now

## 2024-05-13 ENCOUNTER — Other Ambulatory Visit: Payer: Self-pay | Admitting: Diagnostic Neuroimaging

## 2024-05-18 ENCOUNTER — Encounter (HOSPITAL_COMMUNITY): Payer: Self-pay | Admitting: *Deleted

## 2024-05-18 ENCOUNTER — Emergency Department (HOSPITAL_COMMUNITY)

## 2024-05-18 ENCOUNTER — Emergency Department (HOSPITAL_COMMUNITY)
Admission: EM | Admit: 2024-05-18 | Discharge: 2024-05-18 | Disposition: A | Discharge status: Home / Self Care | Attending: Emergency Medicine | Admitting: Emergency Medicine

## 2024-05-18 ENCOUNTER — Ambulatory Visit (HOSPITAL_COMMUNITY): Admission: EM | Admit: 2024-05-18 | Discharge: 2024-05-18 | Disposition: A | Discharge status: Home / Self Care

## 2024-05-18 ENCOUNTER — Other Ambulatory Visit: Payer: Self-pay

## 2024-05-18 DIAGNOSIS — J45909 Unspecified asthma, uncomplicated: Secondary | ICD-10-CM | POA: Insufficient documentation

## 2024-05-18 DIAGNOSIS — G43109 Migraine with aura, not intractable, without status migrainosus: Secondary | ICD-10-CM | POA: Diagnosis not present

## 2024-05-18 DIAGNOSIS — R739 Hyperglycemia, unspecified: Secondary | ICD-10-CM | POA: Diagnosis not present

## 2024-05-18 DIAGNOSIS — R4701 Aphasia: Secondary | ICD-10-CM | POA: Diagnosis present

## 2024-05-18 DIAGNOSIS — R4189 Other symptoms and signs involving cognitive functions and awareness: Secondary | ICD-10-CM

## 2024-05-18 DIAGNOSIS — R519 Headache, unspecified: Secondary | ICD-10-CM

## 2024-05-18 DIAGNOSIS — R4789 Other speech disturbances: Secondary | ICD-10-CM

## 2024-05-18 DIAGNOSIS — R200 Anesthesia of skin: Secondary | ICD-10-CM

## 2024-05-18 DIAGNOSIS — R42 Dizziness and giddiness: Secondary | ICD-10-CM

## 2024-05-18 LAB — DIFFERENTIAL
Abs Immature Granulocytes: 0.02 K/uL (ref 0.00–0.07)
Basophils Absolute: 0.1 K/uL (ref 0.0–0.1)
Basophils Relative: 1 %
Eosinophils Absolute: 0.3 K/uL (ref 0.0–0.5)
Eosinophils Relative: 3 %
Immature Granulocytes: 0 %
Lymphocytes Relative: 31 %
Lymphs Abs: 2.5 K/uL (ref 0.7–4.0)
Monocytes Absolute: 0.4 K/uL (ref 0.1–1.0)
Monocytes Relative: 4 %
Neutro Abs: 4.9 K/uL (ref 1.7–7.7)
Neutrophils Relative %: 61 %

## 2024-05-18 LAB — I-STAT CHEM 8, ED
BUN: 15 mg/dL (ref 6–20)
Calcium, Ion: 1.16 mmol/L (ref 1.15–1.40)
Chloride: 104 mmol/L (ref 98–111)
Creatinine, Ser: 0.8 mg/dL (ref 0.61–1.24)
Glucose, Bld: 183 mg/dL — ABNORMAL HIGH (ref 70–99)
HCT: 49 % (ref 39.0–52.0)
Hemoglobin: 16.7 g/dL (ref 13.0–17.0)
Potassium: 3.5 mmol/L (ref 3.5–5.1)
Sodium: 140 mmol/L (ref 135–145)
TCO2: 23 mmol/L (ref 22–32)

## 2024-05-18 LAB — COMPREHENSIVE METABOLIC PANEL WITH GFR
ALT: 39 U/L (ref 0–44)
AST: 27 U/L (ref 15–41)
Albumin: 3.8 g/dL (ref 3.5–5.0)
Alkaline Phosphatase: 61 U/L (ref 38–126)
Anion gap: 10 (ref 5–15)
BUN: 14 mg/dL (ref 6–20)
CO2: 25 mmol/L (ref 22–32)
Calcium: 9.8 mg/dL (ref 8.9–10.3)
Chloride: 105 mmol/L (ref 98–111)
Creatinine, Ser: 0.88 mg/dL (ref 0.61–1.24)
GFR, Estimated: >60 mL/min (ref >60)
Glucose, Bld: 187 mg/dL — ABNORMAL HIGH (ref 70–99)
Potassium: 3.7 mmol/L (ref 3.5–5.1)
Sodium: 140 mmol/L (ref 135–145)
Total Bilirubin: 0.4 mg/dL (ref 0.0–1.2)
Total Protein: 6.8 g/dL (ref 6.5–8.1)

## 2024-05-18 LAB — CBC
HCT: 47.1 % (ref 39.0–52.0)
Hemoglobin: 16.6 g/dL (ref 13.0–17.0)
MCH: 30.1 pg (ref 26.0–34.0)
MCHC: 35.2 g/dL (ref 30.0–36.0)
MCV: 85.3 fL (ref 80.0–100.0)
Platelets: 269 K/uL (ref 150–400)
RBC: 5.52 MIL/uL (ref 4.22–5.81)
RDW: 12.4 % (ref 11.5–15.5)
WBC: 8.1 K/uL (ref 4.0–10.5)
nRBC: 0 % (ref 0.0–0.2)

## 2024-05-18 LAB — RAPID URINE DRUG SCREEN, HOSP PERFORMED
Amphetamines: NOT DETECTED
Barbiturates: NOT DETECTED
Benzodiazepines: NOT DETECTED
Cocaine: NOT DETECTED
Opiates: NOT DETECTED
Tetrahydrocannabinol: NOT DETECTED

## 2024-05-18 LAB — PROTIME-INR
INR: 1 (ref 0.8–1.2)
Prothrombin Time: 13.4 s (ref 11.4–15.2)

## 2024-05-18 LAB — HEMOGLOBIN A1C
Hgb A1c MFr Bld: 5.7 % — ABNORMAL HIGH (ref 4.8–5.6)
Mean Plasma Glucose: 116.89 mg/dL

## 2024-05-18 LAB — ETHANOL: Alcohol, Ethyl (B): <15 mg/dL (ref <15)

## 2024-05-18 MED ORDER — CLONAZEPAM 0.5 MG PO TABS
0.5000 mg | ORAL_TABLET | Freq: Once | ORAL | Status: AC
  Administered 2024-05-18: 0.5 mg via ORAL
  Filled 2024-05-18: qty 1

## 2024-05-18 MED ORDER — IOHEXOL 350 MG/ML SOLN
75.0000 mL | Freq: Once | INTRAVENOUS | Status: AC | PRN
  Administered 2024-05-18: 75 mL via INTRAVENOUS

## 2024-05-18 MED ORDER — LACTATED RINGERS IV BOLUS
1000.0000 mL | Freq: Once | INTRAVENOUS | Status: AC
  Administered 2024-05-18: 1000 mL via INTRAVENOUS

## 2024-05-18 MED ORDER — KETOROLAC TROMETHAMINE 15 MG/ML IJ SOLN
15.0000 mg | Freq: Once | INTRAMUSCULAR | Status: AC
  Administered 2024-05-18: 15 mg via INTRAVENOUS
  Filled 2024-05-18: qty 1

## 2024-05-18 MED ORDER — DIPHENHYDRAMINE HCL 50 MG/ML IJ SOLN
12.5000 mg | Freq: Once | INTRAMUSCULAR | Status: AC
  Administered 2024-05-18: 12.5 mg via INTRAVENOUS
  Filled 2024-05-18: qty 1

## 2024-05-18 MED ORDER — PROCHLORPERAZINE EDISYLATE 10 MG/2ML IJ SOLN
10.0000 mg | Freq: Once | INTRAMUSCULAR | Status: AC
  Administered 2024-05-18: 10 mg via INTRAVENOUS
  Filled 2024-05-18: qty 2

## 2024-05-18 NOTE — ED Triage Notes (Signed)
 C/O having intermittent numbness to bilat hands over past week. States last night felt his balance was off, having some dizzines, I felt so strange, like out of body experience. Today continues to feel bad with dizziness, and had an episode where I couldn't get my words out. Also c/o HA to back of head onset approx 1/5 hrs ago, and like a brain fog. Took 2 Goody Powders without relief.  States has hx anxiety and migraines, but these sxs feel completely different than his normal sxs.

## 2024-05-18 NOTE — ED Provider Notes (Signed)
  23 year old male presents with intermittent hand numbness over the past week, now accompanied by new-onset imbalance, dizziness, aphasia, headache, and brain fog within the past 24 hours. He took two doses of Good Powder without relief. Although he has a history of anxiety and migraines, he reports that these symptoms feel different from his typical episodes. Medical records show he is followed by neurology (last seen January 2025 by Dr. Margaret) for similar but milder complaints including dizziness, imbalance, numbness, and headaches. He also has a history of moderate sleep apnea (on CPAP), anxiety, panic attacks, and unremarkable CT imaging. An MRI was ordered in January to evaluate for stroke or demyelinating disease, but the patient states it was never scheduled. On exam today, he is alert, oriented, and in no acute distress with stable vitals, normal gait, clear speech, and full range of motion. Given that today's symptoms differ from his baseline and include new neurologic features such as aphasia and worsening imbalance, ED evaluation is advised to rule out acute neurologic processes. Patient is stable and agreeable to transport via private vehicle, accompanied by his wife.       Joshua [name], [place] 05/18/24 1610

## 2024-05-18 NOTE — Discharge Instructions (Signed)
 Your test results today were reassuring.  You should follow-up on results of your A1c test.  This can determine if you have diabetes or prediabetes.  Result should be available by tomorrow.  Although you were unable to complete MRI imaging, the images that they did get did not show any new findings.  You should follow-up with your neurologist for reevaluation and consideration of repeat attempt of MRIs.  In the meantime, return to the emergency department for any new or worsening symptoms of concern.

## 2024-05-18 NOTE — ED Notes (Signed)
 The patient requested that the heat be turned down in his room.  Facilities was contacted to complete the task.
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
## 2024-05-18 NOTE — ED Provider Notes (Signed)
 [place] EMERGENCY DEPARTMENT AT [facility] Provider Note   CSN: [id number] Arrival date & time: 05/18/24  1554     Patient presents with: Dizziness and Code Stroke   Joshua [name] is a 23 y.o. male.    Dizziness Associated symptoms: headaches   Patient presents for word finding difficulty.  Medical history includes migraines, anxiety, asthma, TBI, prediabetes.  Prescribed medications include amitriptyline , Klonopin , Imitrex , meclizine .  He states that he has had ongoing issues with intermittent dizziness, bilateral hand numbness, feeling off balance.  He has been seen by doctors and has been told that it is his anxiety.  He is prescribed Klonopin  and takes it as needed.  His last dose of this was this morning.  Last night, he had an episode of vertigo which resolved prior to him going to bed.  This morning, he continued to feel off balance.  He had a generalized headache.  Today, while at work, patient was attempting to ask Laurina about a part that he would need.  He was unable to think of the words.  This occurred approximately 30 minutes prior to arrival.  He was seen in urgent care.  He was sent to the ED for further evaluation.  Currently, patient endorses anxiety.  He feels that his word finding difficulty has improved.     Prior to Admission medications   Medication Sig Start Date End Date Taking? Authorizing Provider  albuterol  (VENTOLIN  HFA) 108 (90 Base) MCG/ACT inhaler Inhale 2 puffs into the lungs every 4 (four) hours as needed for wheezing or shortness of breath. 10/30/23   Piontek, Rocky, MD  amitriptyline  (ELAVIL ) 25 MG tablet TAKE 1 TABLET BY MOUTH EVERYDAY AT BEDTIME 01/10/24   Penumalli, Vikram R, MD  brompheniramine-pseudoephedrine-DM 30-2-10 MG/5ML syrup Take 5 mLs by mouth 4 (four) times daily as needed. 03/22/24   Vivienne Delon HERO, PA-C  clonazePAM  (KLONOPIN ) 0.5 MG tablet TAKE 1 TABLET BY MOUTH 2 TIMES DAILY AS NEEDED FOR ANXIETY. 03/04/24   Thedora Garnette HERO,  MD  cyclobenzaprine  (FLEXERIL ) 10 MG tablet Take 1 tablet (10 mg total) by mouth 2 (two) times daily as needed for muscle spasms. 10/20/23   Minnie Tinnie BRAVO, PA  meclizine  (ANTIVERT ) 12.5 MG tablet TAKE 1 TABLET (12.5 MG TOTAL) BY MOUTH 2 (TWO) TIMES DAILY AS NEEDED FOR DIZZINESS. 05/14/24   Penumalli, Eduard SAUNDERS, MD  meloxicam  (MOBIC ) 15 MG tablet Take 1 tablet (15 mg total) by mouth daily. 04/22/24   Vivienne Delon HERO, PA-C  predniSONE  (DELTASONE ) 20 MG tablet Take 2 tablets (40 mg total) by mouth daily with breakfast. 03/22/24   Vivienne Delon HERO, PA-C  Rimegepant Sulfate (NURTEC) 75 MG TBDP Take 1 tablet (75 mg total) by mouth daily as needed. 12/11/23   Penumalli, Vikram R, MD  SUMAtriptan  (IMITREX ) 100 MG tablet Take 1 tablet (100 mg total) by mouth once as needed for migraine. May repeat x 1 after 2 hours; maximum 2 tabs per day and 8 tabs per month 01/03/24   Penumalli, Vikram R, MD    Allergies: Penicillin g, Penicillins, and Zofran  [ondansetron  hcl]    Review of Systems  Musculoskeletal:  Positive for gait problem.  Neurological:  Positive for dizziness, speech difficulty and headaches.  Psychiatric/Behavioral:  The patient is nervous/anxious.   All other systems reviewed and are negative.   Updated Vital Signs BP (!) 114/58   Pulse 75   Temp 98.4 F (36.9 C)   Resp 20   Ht 6' (1.829 m)  Wt 127.5 kg   SpO2 95%   BMI 38.12 kg/m   Physical Exam Vitals and nursing note reviewed.  Constitutional:      General: He is not in acute distress.    Appearance: Normal appearance. He is well-developed. He is not ill-appearing, toxic-appearing or diaphoretic.  HENT:     Head: Normocephalic and atraumatic.     Right Ear: External ear normal.     Left Ear: External ear normal.     Nose: Nose normal.     Mouth/Throat:     Mouth: Mucous membranes are moist.  Eyes:     Extraocular Movements: Extraocular movements intact.     Conjunctiva/sclera: Conjunctivae normal.  Cardiovascular:      Rate and Rhythm: Normal rate and regular rhythm.  Pulmonary:     Effort: Pulmonary effort is normal. No respiratory distress.  Abdominal:     General: There is no distension.     Palpations: Abdomen is soft.  Musculoskeletal:        General: No swelling. Normal range of motion.     Cervical back: Normal range of motion and neck supple.  Skin:    General: Skin is warm and dry.     Coloration: Skin is not jaundiced or pale.  Neurological:     General: No focal deficit present.     Mental Status: He is alert and oriented to person, place, and time.     Cranial Nerves: No cranial nerve deficit.     Sensory: No sensory deficit.     Motor: No weakness.     Coordination: Coordination normal.  Psychiatric:        Mood and Affect: Mood is anxious.        Speech: Speech normal.        Behavior: Behavior normal. Behavior is cooperative.        Thought Content: Thought content normal.     (all labs ordered are listed, but only abnormal results are displayed) Labs Reviewed  COMPREHENSIVE METABOLIC PANEL WITH GFR - Abnormal; Notable for the following components:      Result Value   Glucose, Bld 187 (*)    All other components within normal limits  I-STAT CHEM 8, ED - Abnormal; Notable for the following components:   Glucose, Bld 183 (*)    All other components within normal limits  PROTIME-INR  CBC  DIFFERENTIAL  ETHANOL  RAPID URINE DRUG SCREEN, HOSP PERFORMED  HEMOGLOBIN A1C    EKG: EKG Interpretation Date/Time:  Saturday May 18 2024 17:25:01 EDT Ventricular Rate:  87 PR Interval:  150 QRS Duration:  78 QT Interval:  334 QTC Calculation: 402 R Axis:   70  Text Interpretation: Sinus rhythm Borderline repolarization abnormality Confirmed by Melvenia Motto [phone]) on 05/18/2024 5:47:27 PM  Radiology: MR BRAIN WO CONTRAST Result Date: 05/18/2024 CLINICAL DATA:  Initial evaluation for acute neuro deficit, stroke suspected. EXAM: MRI HEAD WITHOUT CONTRAST TECHNIQUE: Multiplanar,  multiecho pulse sequences of the brain and surrounding structures were obtained without intravenous contrast. COMPARISON:  Comparison made with prior CTs from earlier the same day. FINDINGS: Brain: Limited exam with only diffusion weighted imaging performed. DWI sequences demonstrate no evidence for acute or subacute ischemia. Gray-white matter differentiation maintained. No visible areas of chronic cortical infarction. No visible mass lesion, mass effect or midline shift. No hydrocephalus or extra-axial fluid collection. Vascular: Not well assessed on this limited exam. Skull and upper cervical spine: Not well assessed on this limited exam. Sinuses/Orbits: Not  well assessed on this limited exam. Other: None. IMPRESSION: 1. Limited exam with only diffusion weighted imaging performed. 2. No evidence for acute or subacute ischemia. Electronically Signed   By: Morene Hoard M.D.   On: 05/18/2024 20:04   CT ANGIO HEAD NECK W WO CM (CODE STROKE) Result Date: 05/18/2024 CLINICAL DATA:  Neuro deficit, concern for stroke, intermittent numbness in both hands over the past week. Balance difficulty and dizziness. EXAM: CT ANGIOGRAPHY HEAD AND NECK WITH AND WITHOUT CONTRAST TECHNIQUE: Multidetector CT imaging of the head and neck was performed using the standard protocol during bolus administration of intravenous contrast. Multiplanar CT image reconstructions and MIPs were obtained to evaluate the vascular anatomy. Carotid stenosis measurements (when applicable) are obtained utilizing NASCET criteria, using the distal internal carotid diameter as the denominator. RADIATION DOSE REDUCTION: This exam was performed according to the departmental dose-optimization program which includes automated exposure control, adjustment of the mA and/or kV according to patient size and/or use of iterative reconstruction technique. CONTRAST:  75mL OMNIPAQUE  IOHEXOL  350 MG/ML SOLN COMPARISON:  Same-day head CT. FINDINGS: CTA NECK  FINDINGS Aortic arch: Limited visualization of the aortic arch. Pulmonary arteries: As permitted by contrast timing, there are no filling defects in the visualized pulmonary arteries. Subclavian arteries: The subclavian arteries are patent bilaterally. Right carotid system: No evidence of dissection, stenosis (50% or greater), or occlusion. Left carotid system: No evidence of dissection, stenosis (50% or greater), or occlusion. Vertebral arteries: Codominant. No evidence of dissection, stenosis (50% or greater), or occlusion. Skeleton: No acute or aggressive finding noted. Other neck: The visualized airway is patent. No cervical lymphadenopathy. Upper chest: Visualized lung apices are clear. Review of the MIP images confirms the above findings CTA HEAD FINDINGS ANTERIOR CIRCULATION: The intracranial ICAs are patent bilaterally. No significant stenosis, proximal occlusion, aneurysm, or vascular malformation. MCAs: The middle cerebral arteries are patent bilaterally. ACAs: The anterior cerebral arteries are patent bilaterally. POSTERIOR CIRCULATION: No significant stenosis, proximal occlusion, aneurysm, or vascular malformation. PCAs: The posterior cerebral arteries are patent bilaterally. Pcomm: The posterior communicating arteries are visualized bilaterally. SCAs: The superior cerebellar arteries are patent bilaterally. Basilar artery: Patent AICAs: Patent PICAs: Patent Vertebral arteries: The intracranial vertebral arteries are patent. Venous sinuses: As permitted by contrast timing, patent. Anatomic variants: None Review of the MIP images confirms the above findings IMPRESSION: No large vessel occlusion. No high-grade stenosis, aneurysm, or dissection of the arteries in the head and neck. Electronically Signed   By: Donnice Mania M.D.   On: 05/18/2024 17:25   CT HEAD CODE STROKE WO CONTRAST Result Date: 05/18/2024 CLINICAL DATA:  Code stroke. Neuro deficit, concern for stroke, slurred speech, left leg ataxia.  EXAM: CT HEAD WITHOUT CONTRAST TECHNIQUE: Contiguous axial images were obtained from the base of the skull through the vertex without intravenous contrast. RADIATION DOSE REDUCTION: This exam was performed according to the departmental dose-optimization program which includes automated exposure control, adjustment of the mA and/or kV according to patient size and/or use of iterative reconstruction technique. COMPARISON:  CT head 03/13/2023. FINDINGS: Brain: No acute intracranial hemorrhage. No CT evidence of acute infarct. No edema, mass effect, or midline shift. The basilar cisterns are patent. Ventricles: The ventricles are normal. Vascular: No hyperdense vessel or unexpected calcification. Skull: No acute or aggressive finding. Orbits: Orbits are symmetric. Sinuses: Mucosal thickening throughout the ethmoid sinuses and in the sphenoid sinuses, right greater than left. Decreased secretions in the right maxillary sinus from prior. Prominent mucous retention cyst in the  left maxillary sinus. Other: Mastoid air cells are clear. ASPECTS [facility] Stroke Program Early CT Score) - Ganglionic level infarction (caudate, lentiform nuclei, internal capsule, insula, M1-M3 cortex): 7 - Supraganglionic infarction (M4-M6 cortex): 3 Total score (0-10 with 10 being normal): 10 IMPRESSION: 1. No CT evidence of acute intracranial abnormality. 2. ASPECTS is 10 These results were communicated to Dr. Michaela at 4:43 pm on 05/18/2024 by text page via the [facility] messaging system. Electronically Signed   By: Donnice Mania M.D.   On: 05/18/2024 16:43     Procedures   Medications Ordered in the ED  ketorolac  (TORADOL ) 15 MG/ML injection 15 mg (15 mg Intravenous Given 05/18/24 1826)  prochlorperazine  (COMPAZINE ) injection 10 mg (10 mg Intravenous Given 05/18/24 1828)  diphenhydrAMINE  (BENADRYL ) injection 12.5 mg (12.5 mg Intravenous Given 05/18/24 1821)  lactated ringers  bolus 1,000 mL (1,000 mLs Intravenous New Bag/Given 05/18/24 1837)   iohexol  (OMNIPAQUE ) 350 MG/ML injection 75 mL (75 mLs Intravenous Contrast Given 05/18/24 1658)  clonazePAM  (KLONOPIN ) tablet 0.5 mg (0.5 mg Oral Given 05/18/24 1834)                                    Medical Decision Making Amount and/or Complexity of Data Reviewed Labs: ordered. Radiology: ordered.  Risk Prescription drug management.   This patient presents to the ED for concern of word finding difficulty, this involves an extensive number of treatment options, and is a complaint that carries with it a high risk of complications and morbidity.  The differential diagnosis includes CVA, complex migraine, anxiety, dehydration, metabolic derangements   Co morbidities / Chronic conditions that complicate the patient evaluation  migraines, anxiety, asthma, TBI, prediabetes   Additional history obtained:  Additional history obtained from EMR External records from outside source obtained and reviewed including N/A   Lab Tests:  I Ordered, and personally interpreted labs.  The pertinent results include: Normal hemoglobin, no leukocytosis, normal kidney function, normal electrolytes.  Glucose mildly elevated.   Imaging Studies ordered:  I ordered imaging studies including CT head, CTA head and neck, MRI brain, MRI cervical spine I independently visualized and interpreted imaging which showed no acute findings.  Patient unable to tolerate completion of MRI scans. I agree with the radiologist interpretation   Cardiac Monitoring: / EKG:  The patient was maintained on a cardiac monitor.  I personally viewed and interpreted the cardiac monitored which showed an underlying rhythm of: Sinus rhythm   Problem List / ED Course / Critical interventions / Medication management  Patient presenting for word finding difficulty.  Onset was 30 minutes prior to arrival.  This was noted in ED triage and code stroke was initiated.  Patient underwent CT imaging.  Noncontrasted CT does not show any  acute findings.  On reassessment, word finding difficulty has improved.  He endorses ongoing anxiety.  His home dose of Klonopin  was ordered.  He does have a history of migraines and neurologist, Dr. Michaela, recommends migraine cocktail in addition to MRI of the brain and cervical spine.  These were ordered. Patient's lab work shows normal kidney function, normal electrolytes, no leukocytosis.  CTA did not show any acute findings.  During MRI, patient was unable to tolerate completion of the scan due to anxiety and claustrophobia.  He returned to the ED.  He states that he has felt fine and has been ambulating without difficulty.  Limited MRI did not show any acute  findings.  Patient does request discharge home at this time.  He is followed by a neurologist and will be able to follow-up outpatient.  He was discharged in stable condition. I ordered medication including IV fluids, Compazine , Toradol , Benadryl  for headache; Klonopin  for anxiety Reevaluation of the patient after these medicines showed that the patient improved I have reviewed the patients home medicines and have made adjustments as needed   Consultations Obtained:  I requested consultation with the neurologist, Dr. Michaela,  and discussed lab and imaging findings as well as pertinent plan - they recommend: Migraine cocktail, MRI brain and cervical spine, discharge if improved and MRI is negative.   Social Determinants of Health:  Lives at home with family, has access to outpatient care     Final diagnoses:  Word finding difficulty    ED Discharge Orders     None          Melvenia Motto, MD 05/18/24 2047

## 2024-05-18 NOTE — ED Notes (Signed)
 Pt passed swallow screen

## 2024-05-18 NOTE — Consult Note (Signed)
 NEUROLOGY CONSULT NOTE   Date of service: May 18, 2024 Patient Name: Joshua [name] MRN:  [id number] DOB:  Jan 22, 2001 Chief Complaint: Trouble forming words Requesting Provider: Melvenia Motto, MD  History of Present Illness  Joshua [name] is a 23 y.o. male with hx of headaches who has been having headaches on and off for the past week in addition has been having tingling of bilateral arms.  Today, he started having numbness and weakness of his left side and has been having trouble with speaking intermittently.  He states that he tried to tell his coworker that he needed a hole saw but was unable to get the words out.  He therefore sought care in the emergency department where a code stroke was activated.  LKW: Unclear given multiple independent symptoms Modified rankin score: Zero IV Thrombolysis: No, unclear time of onset  NIHSS components Score: Comment  1a Level of Conscious 0[x]  1[]  2[]  3[]      1b LOC Questions 0[x]  1[]  2[]       1c LOC Commands 0[x]  1[]  2[]       2 Best Gaze 0[x]  1[]  2[]       3 Visual 0[x]  1[]  2[]  3[]      4 Facial Palsy 0[x]  1[]  2[]  3[]      5a Motor Arm - left 0[]  1[]  2[]  3[]  4[]  UN[]    5b Motor Arm - Right 0[]  1[]  2[]  3[]  4[]  UN[]    6a Motor Leg - Left 0[]  1[x]  2[]  3[]  4[]  UN[]    6b Motor Leg - Right 0[]  1[]  2[]  3[]  4[]  UN[]    7 Limb Ataxia 0[]  1[x]  2[]  UN[]      8 Sensory 0[]  1[]  2[]  UN[]      9 Best Language 0[]  1[]  2[]  3[]      10 Dysarthria 0[]  1[]  2[]  UN[]      11 Extinct. and Inattention 0[]  1[]  2[]       TOTAL: 2     Past History   Past Medical History:  Diagnosis Date   Anxiety    Asthma    Closed head injury    Common migraine with intractable migraine 05/30/2017   Diarrhea    History of pelvic fracture    History of spinal fracture    Medical history non-contributory    Seasonal allergies    Vomiting     Past Surgical History:  Procedure Laterality Date   NO PAST SURGERIES      Family History: Family History  Problem Relation Age of  Onset   Hypertension Mother    Hypertension Sister    Diabetes Maternal Grandmother    Hypertension Maternal Grandmother    Cancer Maternal Grandfather    Hirschsprung's disease Neg Hx     Social History  reports that he has been smoking cigarettes. He has never used smokeless tobacco. He reports current alcohol use. He reports that he does not currently use drugs after having used the following drugs: Marijuana.  Allergies  Allergen Reactions   Penicillin G     Other Reaction(s): Other (See Comments)  Does not remember   Penicillins Hives   Zofran  [Ondansetron  Hcl] Other (See Comments)    dizziness    Medications  No current facility-administered medications for this encounter.  Current Outpatient Medications:    albuterol  (VENTOLIN  HFA) 108 (90 Base) MCG/ACT inhaler, Inhale 2 puffs into the lungs every 4 (four) hours as needed for wheezing or shortness of breath., Disp: 1 each, Rfl: 0   amitriptyline  (ELAVIL ) 25 MG tablet, TAKE 1 TABLET BY  MOUTH EVERYDAY AT BEDTIME, Disp: 90 tablet, Rfl: 1   brompheniramine-pseudoephedrine-DM 30-2-10 MG/5ML syrup, Take 5 mLs by mouth 4 (four) times daily as needed., Disp: 120 mL, Rfl: 0   clonazePAM  (KLONOPIN ) 0.5 MG tablet, TAKE 1 TABLET BY MOUTH 2 TIMES DAILY AS NEEDED FOR ANXIETY., Disp: 30 tablet, Rfl: 0   cyclobenzaprine  (FLEXERIL ) 10 MG tablet, Take 1 tablet (10 mg total) by mouth 2 (two) times daily as needed for muscle spasms., Disp: 20 tablet, Rfl: 0   meclizine  (ANTIVERT ) 12.5 MG tablet, TAKE 1 TABLET (12.5 MG TOTAL) BY MOUTH 2 (TWO) TIMES DAILY AS NEEDED FOR DIZZINESS., Disp: 30 tablet, Rfl: 1   meloxicam  (MOBIC ) 15 MG tablet, Take 1 tablet (15 mg total) by mouth daily., Disp: 30 tablet, Rfl: 0   predniSONE  (DELTASONE ) 20 MG tablet, Take 2 tablets (40 mg total) by mouth daily with breakfast., Disp: 14 tablet, Rfl: 0   Rimegepant Sulfate (NURTEC) 75 MG TBDP, Take 1 tablet (75 mg total) by mouth daily as needed., Disp: 8 tablet, Rfl: 6    SUMAtriptan  (IMITREX ) 100 MG tablet, Take 1 tablet (100 mg total) by mouth once as needed for migraine. May repeat x 1 after 2 hours; maximum 2 tabs per day and 8 tabs per month, Disp: 8 tablet, Rfl: 6  Vitals   Vitals:   05/18/24 1559 05/18/24 1607  BP: 134/81   Pulse: (!) 104   Resp: 18   Temp: 98.4 F (36.9 C)   SpO2: 94%   Weight:  127.5 kg  Height:  6' (1.829 m)    Body mass index is 38.12 kg/m.   Physical Exam   Constitutional: Appears well-developed and well-nourished.   Neurologic Examination    Neuro: Mental Status: Patient is awake, alert, oriented to person, place, month, year, and situation. Patient is able to give a clear and coherent history. No signs of aphasia or neglect Cranial Nerves: II: Visual Fields are full. Pupils are equal, round, and reactive to light.   III,IV, VI: EOMI without ptosis or diploplia.  V: Facial sensation is symmetric to temperature VII: Facial movement is symmetric.  VIII: hearing is intact to voice X: Uvula elevates symmetrically XII: tongue is midline without atrophy or fasciculations.  Motor: Tone is normal. Bulk is normal. 5/5 strength was present in bilateral arms, he does have mild weakness of the left lower extremity Sensory: Sensation is symmetric to light touch and temperature in the arms and legs. Cerebellar: He appears to have some mild ataxia out of proportion to weakness in the left lower extremity       Labs/Imaging/Neurodiagnostic studies   CBC: No results for input(s): WBC, NEUTROABS, HGB, HCT, MCV, PLT in the last 168 hours. Basic Metabolic Panel:  Lab Results  Component Value Date   NA 138 02/20/2024   K 3.7 02/20/2024   CO2 22 02/20/2024   GLUCOSE 106 (H) 02/20/2024   BUN 15 02/20/2024   CREATININE 0.73 02/20/2024   CALCIUM 9.3 02/20/2024   GFRNONAA >60 02/20/2024   GFRAA >60 05/06/2019   Lipid Panel:  Lab Results  Component Value Date   LDLCALC 90 08/01/2023   HgbA1c:   Lab Results  Component Value Date   HGBA1C 6.0 08/01/2023   Urine Drug Screen:     Component Value Date/Time   LABOPIA NONE DETECTED 02/04/2023 0255   COCAINSCRNUR NONE DETECTED 02/04/2023 0255   COCAINSCRNUR NONE DETECTED 04/09/2017 0324   LABBENZ NONE DETECTED 02/04/2023 0255   AMPHETMU NONE DETECTED 02/04/2023  0255   THCU NONE DETECTED 02/04/2023 0255   LABBARB NONE DETECTED 02/04/2023 0255    Alcohol Level     Component Value Date/Time   ETH 24 (H) 03/21/2019 1816   INR  Lab Results  Component Value Date   INR 1.1 03/21/2019  CT Head without contrast(Personally reviewed): Negative ASSESSMENT   Joshua [name] is a 23 y.o. male with a waxing/waning paresthesias, numbness, word finding difficulty in setting of photophobic headache.  I think that complicated migraine is the likely etiology, but certainly other etiologies need to be considered.  We can assess venous sinus thrombosis, stroke, multiple sclerosis with MRI.  RECOMMENDATIONS  MRI brain, C-spine as well as MRV head If these are negative, I would favor treating as complicated migraine. ______________________________________________________________________   Bonney Aisha Seals, MD [facility]

## 2024-05-18 NOTE — ED Notes (Signed)
 CCMD contacted to admit the patient for cardiac monitoring.

## 2024-05-18 NOTE — ED Notes (Signed)
 Followed up with Facilities to decrease the heat in [facility].  Facilities' staff stated that he was on the way.

## 2024-05-18 NOTE — ED Notes (Signed)
 Patient is being discharged from the Urgent Care and sent to the Emergency Department via private vehicle with family . Per CANDIE Rana, NP, patient is in need of higher level of care due to dizziness, feeling of disorientation, HA, parasthesias. Patient is aware and verbalizes understanding of plan of care.  Vitals:   05/18/24 1540  BP: 127/81  Pulse: (!) 105  Resp: 16  SpO2: 95%

## 2024-05-18 NOTE — ED Triage Notes (Signed)
 Intermittent dizziness and numbness for one week  he takes meclizine  for dizziness unable to find words today earlier  he just feels weird   he feels like both his  arms feel strange  and multiple other transient symptoms

## 2024-06-10 ENCOUNTER — Telehealth: Admitting: Physician Assistant

## 2024-06-10 ENCOUNTER — Encounter: Payer: Self-pay | Admitting: Physician Assistant

## 2024-06-10 DIAGNOSIS — J069 Acute upper respiratory infection, unspecified: Secondary | ICD-10-CM | POA: Diagnosis not present

## 2024-06-10 DIAGNOSIS — J101 Influenza due to other identified influenza virus with other respiratory manifestations: Secondary | ICD-10-CM

## 2024-06-10 MED ORDER — PSEUDOEPH-BROMPHEN-DM 30-2-10 MG/5ML PO SYRP
5.0000 mL | ORAL_SOLUTION | Freq: Four times a day (QID) | ORAL | 0 refills | Status: AC | PRN
Start: 2024-06-10 — End: ?

## 2024-06-10 MED ORDER — FLUTICASONE PROPIONATE 50 MCG/ACT NA SUSP
2.0000 | Freq: Every day | NASAL | 0 refills | Status: AC
Start: 2024-06-10 — End: ?

## 2024-06-10 MED ORDER — PROMETHAZINE HCL 25 MG PO TABS: 25.0000 mg | ORAL_TABLET | Freq: Three times a day (TID) | ORAL | 0 refills | Status: AC | PRN

## 2024-06-10 MED ORDER — OSELTAMIVIR PHOSPHATE 75 MG PO CAPS
75.0000 mg | ORAL_CAPSULE | Freq: Two times a day (BID) | ORAL | 0 refills | Status: AC
Start: 2024-06-10 — End: ?

## 2024-06-10 MED ORDER — NAPROXEN 500 MG PO TABS: 500.0000 mg | ORAL_TABLET | Freq: Two times a day (BID) | ORAL | 0 refills | Status: AC

## 2024-06-10 NOTE — Progress Notes (Signed)
 Virtual Visit Consent   Joshua [name], you are scheduled for a virtual visit with a [facility] provider today. Just as with appointments in the office, your consent must be obtained to participate. Your consent will be active for this visit and any virtual visit you may have with one of our providers in the next 365 days. If you have a MyChart account, a copy of this consent can be sent to you electronically.  As this is a virtual visit, video technology does not allow for your provider to perform a traditional examination. This may limit your provider's ability to fully assess your condition. If your provider identifies any concerns that need to be evaluated in person or the need to arrange testing (such as labs, EKG, etc.), we will make arrangements to do so. Although advances in technology are sophisticated, we cannot ensure that it will always work on either your end or our end. If the connection with a video visit is poor, the visit may have to be switched to a telephone visit. With either a video or telephone visit, we are not always able to ensure that we have a secure connection.  By engaging in this virtual visit, you consent to the provision of healthcare and authorize for your insurance to be billed (if applicable) for the services provided during this visit. Depending on your insurance coverage, you may receive a charge related to this service.  I need to obtain your verbal consent now. Are you willing to proceed with your visit today? Ianmichael Amescua has provided verbal consent on 06/10/2024 for a virtual visit (video or telephone). Delon CHRISTELLA Dickinson, PA-C  Date: 06/10/2024 11:58 AM   Virtual Visit via Video Note   I, Delon CHRISTELLA Dickinson, connected with  Manley Fason  ([id number], July 05, 2001) on 06/10/24 at 11:30 AM EDT by a video-enabled telemedicine application and verified that I am speaking with the correct person using two identifiers.  Location: Patient: Virtual Visit Location  Patient: Home Provider: Virtual Visit Location Provider: Home Office   I discussed the limitations of evaluation and management by telemedicine and the availability of in person appointments. The patient expressed understanding and agreed to proceed.    History of Present Illness: Joshua [name] is a 23 y.o. who identifies as a male who was assigned male at birth, and is being seen today for URI symptoms.  HPI: URI  This is a new problem. The current episode started in the past 7 days (Symptoms started Sunday). The problem has been gradually worsening. The maximum temperature recorded prior to his arrival was 102 - 102.9 F (chills and hot flashes; 102.3 highest temp). Associated symptoms include abdominal pain, congestion, headaches, nausea, sinus pain and vomiting. Pertinent negatives include no coughing, ear pain, plugged ear sensation, rhinorrhea or sore throat. Associated symptoms comments: Body aches. He has tried acetaminophen  and increased fluids for the symptoms. The treatment provided no relief.     Problems:  Patient Active Problem List   Diagnosis Date Noted   Atypical chest pain 11/14/2023   Generalized anxiety disorder with panic attacks 08/01/2023   Tobacco use 08/01/2023   Prediabetes 08/01/2023   Common migraine with intractable migraine 05/30/2017   Dizziness and giddiness 05/30/2017   Subdural hematoma (HCC)    TBI (traumatic brain injury) (HCC) 06/14/2016    Allergies:  Allergies  Allergen Reactions   Penicillin G     Other Reaction(s): Other (See Comments)  Does not remember   Penicillins Hives   Zofran  [Ondansetron   Hcl] Other (See Comments)    dizziness   Medications:  Current Outpatient Medications:    brompheniramine-pseudoephedrine-DM 30-2-10 MG/5ML syrup, Take 5 mLs by mouth 4 (four) times daily as needed., Disp: 120 mL, Rfl: 0   fluticasone  (FLONASE ) 50 MCG/ACT nasal spray, Place 2 sprays into both nostrils daily., Disp: 16 g, Rfl: 0   naproxen   (NAPROSYN ) 500 MG tablet, Take 1 tablet (500 mg total) by mouth 2 (two) times daily with a meal., Disp: 30 tablet, Rfl: 0   promethazine  (PHENERGAN ) 25 MG tablet, Take 1 tablet (25 mg total) by mouth every 8 (eight) hours as needed for nausea or vomiting., Disp: 20 tablet, Rfl: 0   albuterol  (VENTOLIN  HFA) 108 (90 Base) MCG/ACT inhaler, Inhale 2 puffs into the lungs every 4 (four) hours as needed for wheezing or shortness of breath., Disp: 1 each, Rfl: 0   amitriptyline  (ELAVIL ) 25 MG tablet, TAKE 1 TABLET BY MOUTH EVERYDAY AT BEDTIME, Disp: 90 tablet, Rfl: 1   clonazePAM  (KLONOPIN ) 0.5 MG tablet, TAKE 1 TABLET BY MOUTH 2 TIMES DAILY AS NEEDED FOR ANXIETY., Disp: 30 tablet, Rfl: 0   cyclobenzaprine  (FLEXERIL ) 10 MG tablet, Take 1 tablet (10 mg total) by mouth 2 (two) times daily as needed for muscle spasms., Disp: 20 tablet, Rfl: 0   meclizine  (ANTIVERT ) 12.5 MG tablet, TAKE 1 TABLET (12.5 MG TOTAL) BY MOUTH 2 (TWO) TIMES DAILY AS NEEDED FOR DIZZINESS., Disp: 30 tablet, Rfl: 1   predniSONE  (DELTASONE ) 20 MG tablet, Take 2 tablets (40 mg total) by mouth daily with breakfast., Disp: 14 tablet, Rfl: 0   Rimegepant Sulfate (NURTEC) 75 MG TBDP, Take 1 tablet (75 mg total) by mouth daily as needed., Disp: 8 tablet, Rfl: 6   SUMAtriptan  (IMITREX ) 100 MG tablet, Take 1 tablet (100 mg total) by mouth once as needed for migraine. May repeat x 1 after 2 hours; maximum 2 tabs per day and 8 tabs per month, Disp: 8 tablet, Rfl: 6  Observations/Objective: Patient is well-developed, well-nourished in no acute distress.  Resting comfortably at home.  Head is normocephalic, atraumatic.  No labored breathing.  Speech is clear and coherent with logical content.  Patient is alert and oriented at baseline.    Assessment and Plan: 1. Viral URI with cough (Primary) - fluticasone  (FLONASE ) 50 MCG/ACT nasal spray; Place 2 sprays into both nostrils daily.  Dispense: 16 g; Refill: 0 - naproxen  (NAPROSYN ) 500 MG tablet;  Take 1 tablet (500 mg total) by mouth 2 (two) times daily with a meal.  Dispense: 30 tablet; Refill: 0 - promethazine  (PHENERGAN ) 25 MG tablet; Take 1 tablet (25 mg total) by mouth every 8 (eight) hours as needed for nausea or vomiting.  Dispense: 20 tablet; Refill: 0 - brompheniramine-pseudoephedrine-DM 30-2-10 MG/5ML syrup; Take 5 mLs by mouth 4 (four) times daily as needed.  Dispense: 120 mL; Refill: 0  - Suspect viral URI, suspect Covid - Symptomatic medications of choice over the counter as needed - Naproxen  for pain and fevers - Tylenol  for fevers as needed - Flonase  for headache and facial pain - Bromfed DM for cough and congestion - Promethazine  for nausea - Push fluids - Rest - Seek further evaluation if symptoms change or worsen   Follow Up Instructions: I discussed the assessment and treatment plan with the patient. The patient was provided an opportunity to ask questions and all were answered. The patient agreed with the plan and demonstrated an understanding of the instructions.  A copy of instructions were  sent to the patient via MyChart unless otherwise noted below.    The patient was advised to call back or seek an in-person evaluation if the symptoms worsen or if the condition fails to improve as anticipated.    Delon CHRISTELLA Dickinson, PA-C

## 2024-06-10 NOTE — Patient Instructions (Signed)
 Joshua [name], thank you for joining Delon CHRISTELLA Dickinson, PA-C for today's virtual visit.  While this provider is not your primary care provider (PCP), if your PCP is located in our provider database this encounter information will be shared with them immediately following your visit.   [name] MyChart account gives you access to today's visit and all your visits, tests, and labs performed at [facility]  click here if you don't have a [facility] MyChart account or go to [email or web address]  Consent: (Patient) Joshua [name] provided verbal consent for this virtual visit at the beginning of the encounter.  Current Medications:  Current Outpatient Medications:    brompheniramine-pseudoephedrine-DM 30-2-10 MG/5ML syrup, Take 5 mLs by mouth 4 (four) times daily as needed., Disp: 120 mL, Rfl: 0   fluticasone  (FLONASE ) 50 MCG/ACT nasal spray, Place 2 sprays into both nostrils daily., Disp: 16 g, Rfl: 0   naproxen  (NAPROSYN ) 500 MG tablet, Take 1 tablet (500 mg total) by mouth 2 (two) times daily with a meal., Disp: 30 tablet, Rfl: 0   promethazine  (PHENERGAN ) 25 MG tablet, Take 1 tablet (25 mg total) by mouth every 8 (eight) hours as needed for nausea or vomiting., Disp: 20 tablet, Rfl: 0   albuterol  (VENTOLIN  HFA) 108 (90 Base) MCG/ACT inhaler, Inhale 2 puffs into the lungs every 4 (four) hours as needed for wheezing or shortness of breath., Disp: 1 each, Rfl: 0   amitriptyline  (ELAVIL ) 25 MG tablet, TAKE 1 TABLET BY MOUTH EVERYDAY AT BEDTIME, Disp: 90 tablet, Rfl: 1   clonazePAM  (KLONOPIN ) 0.5 MG tablet, TAKE 1 TABLET BY MOUTH 2 TIMES DAILY AS NEEDED FOR ANXIETY., Disp: 30 tablet, Rfl: 0   cyclobenzaprine  (FLEXERIL ) 10 MG tablet, Take 1 tablet (10 mg total) by mouth 2 (two) times daily as needed for muscle spasms., Disp: 20 tablet, Rfl: 0   meclizine  (ANTIVERT ) 12.5 MG tablet, TAKE 1 TABLET (12.5 MG TOTAL) BY MOUTH 2 (TWO) TIMES DAILY AS NEEDED FOR DIZZINESS., Disp: 30 tablet,  Rfl: 1   predniSONE  (DELTASONE ) 20 MG tablet, Take 2 tablets (40 mg total) by mouth daily with breakfast., Disp: 14 tablet, Rfl: 0   Rimegepant Sulfate (NURTEC) 75 MG TBDP, Take 1 tablet (75 mg total) by mouth daily as needed., Disp: 8 tablet, Rfl: 6   SUMAtriptan  (IMITREX ) 100 MG tablet, Take 1 tablet (100 mg total) by mouth once as needed for migraine. May repeat x 1 after 2 hours; maximum 2 tabs per day and 8 tabs per month, Disp: 8 tablet, Rfl: 6   Medications ordered in this encounter:  Meds ordered this encounter  Medications   fluticasone  (FLONASE ) 50 MCG/ACT nasal spray    Sig: Place 2 sprays into both nostrils daily.    Dispense:  16 g    Refill:  0    Supervising Provider:   BLAISE ALEENE KIDD [[id number]]   naproxen  (NAPROSYN ) 500 MG tablet    Sig: Take 1 tablet (500 mg total) by mouth 2 (two) times daily with a meal.    Dispense:  30 tablet    Refill:  0    Supervising Provider:   LAMPTEY, PHILIP O [[id number]]   promethazine  (PHENERGAN ) 25 MG tablet    Sig: Take 1 tablet (25 mg total) by mouth every 8 (eight) hours as needed for nausea or vomiting.    Dispense:  20 tablet    Refill:  0    Supervising Provider:   BLAISE ALEENE KIDD [[id number]]   brompheniramine-pseudoephedrine-DM 30-2-10 MG/5ML  syrup    Sig: Take 5 mLs by mouth 4 (four) times daily as needed.    Dispense:  120 mL    Refill:  0    Supervising Provider:   BLAISE ALEENE KIDD [[id number]]     *If you need refills on other medications prior to your next appointment, please contact your pharmacy*  Follow-Up: Call back or seek an in-person evaluation if the symptoms worsen or if the condition fails to improve as anticipated.  [facility] [phone]  Other Instructions Viral Illness, Adult Viruses are tiny germs that can get into a person's body and cause illness. There are many different types of viruses. And they cause many types of illness. Viral illnesses can range from mild to severe. They can  affect various parts of the body. Short-term conditions that are caused by a virus include colds and flu (influenza) and stomach viruses. Long-term conditions that are caused by a virus include herpes, shingles, and human immunodeficiency virus (HIV) infection. A few viruses have been linked to certain cancers. What are the causes? Many types of viruses can cause illness. Viruses get into cells in your body, multiply, and cause the infected cells to work differently or die. When these cells die, they release more of the virus. When this happens, you get symptoms of the illness and the virus spreads to other cells. If the virus takes over how the cell works, it can cause the cell to divide and grow out of control. This happens when a virus causes cancer. Different viruses get into the body in different ways. You can get a virus by: Swallowing food or water that has come in contact with the virus. Breathing in droplets that have been coughed or sneezed into the air by an infected person. Touching a surface that has the virus on it and then touching your eyes, nose, or mouth. Being bitten by an insect or animal that carries the virus. Having sexual contact with a person who is infected with the virus. Being exposed to blood or fluids that contain the virus, either through an open cut or during a transfusion. If a virus enters your body, your body's disease-fighting system (immune system) will try to fight the virus. You may be at higher risk for a viral illness if your immune system is weak. What are the signs or symptoms? Symptoms depend on the type of virus and the location of the cells that it gets into. Symptoms can include: For cold and flu viruses: Fever. Headache. Sore throat. Muscle aches. Stuffy nose (nasal congestion). Cough. For stomach (gastrointestinal) viruses: Fever. Pain in the abdomen. Nausea or vomiting. Diarrhea. For liver viruses (hepatitis): Loss of appetite. Feeling  tired. Skin or the white parts of your eyes turning yellow (jaundice). For brain and spinal cord viruses: Fever. Headache. Stiff neck. Nausea and vomiting. Confusion or being sleepy. For skin viruses: Warts. Itching. Rash. For sexually transmitted viruses: Discharge. Swelling. Redness. Rash. How is this diagnosed? This condition may be diagnosed based on one or more of these: Your symptoms and medical history. A physical exam. Tests, such as: Blood tests. Tests on a sample of mucus from your lungs (sputum sample). Tests on a poop (stool) sample. Tests on a swab of body fluids or a skin sore (lesion). How is this treated? Viruses can be hard to treat because they live within cells. Antibiotics do not treat viruses because these medicines do not get inside cells. Treatment for a viral illness may  include: Resting and drinking a lot of fluids. Medicines to treat symptoms. These can include over-the-counter medicine for pain and fever, medicines for cough or congestion, and medicines for diarrhea. Antiviral medicines. These medicines are available only for certain types of viruses. Some viral illnesses can be prevented with vaccinations. A common example is the flu shot. Follow these instructions at home: Medicines Take over-the-counter and prescription medicines only as told by your health care provider. If you were prescribed an antiviral medicine, take it as told by your provider. Do not stop taking the antiviral even if you start to feel better. Know when antibiotics are needed and when they are not needed. Antibiotics do not treat viruses. You may get an antibiotic if your provider thinks that you may have, or are at risk for, a bacterial infection and you have a viral infection. Do not ask for an antibiotic prescription if you have been diagnosed with a viral illness. Antibiotics will not make your illness go away faster. Taking antibiotics when they are not needed can lead to  antibiotic resistance. When this develops, the medicine no longer works against the bacteria that it normally fights. General instructions Drink enough fluids to keep your pee (urine) pale yellow. Rest as much as possible. Return to your normal activities as told by your provider. Ask your provider what activities are safe for you. How is this prevented? To lower your risk of getting another viral illness: Wash your hands often with soap and water for at least 20 seconds. If soap and water are not available, use hand sanitizer. Avoid touching your nose, eyes, and mouth, especially if you have not washed your hands recently. If anyone in your household has a viral infection, clean all household surfaces that may have been in contact with the virus. Use soap and hot water. You may also use a commercially prepared, bleach-containing solution. Stay away from people who are sick with symptoms of a viral infection. Do not share items such as toothbrushes and water bottles with other people. Keep your vaccinations up to date. This includes getting a yearly flu shot. Eat a healthy diet and get plenty of rest. Contact a health care provider if: You have symptoms of a viral illness that do not go away. Your symptoms come back after going away. Your symptoms get worse. Get help right away if: You have trouble breathing. You have a severe headache or a stiff neck. You have severe vomiting or pain in your abdomen. These symptoms may be an emergency. Get help right away. Call 911. Do not wait to see if the symptoms will go away. Do not drive yourself to the hospital. This information is not intended to replace advice given to you by your health care provider. Make sure you discuss any questions you have with your health care provider. Document Revised: 11/16/2022 Document Reviewed: 08/31/2022 Elsevier Patient Education  2024 Elsevier [employer].   If you have been instructed to have an in-person evaluation  today at a local Urgent Care facility, please use the link below. It will take you to a list of all of our available [facility], including address, phone number and hours of operation. Please do not delay care.  [facility]  If you or a family member do not have a primary care provider, use the link below to schedule a visit and establish care. When you choose a  primary care physician or advanced practice provider, you gain a long-term  partner in health. Find a Primary Care Provider  Learn more about [facility] in-office and virtual care options: [facility] Get Care Now

## 2024-07-25 ENCOUNTER — Ambulatory Visit: Admitting: Family Medicine

## 2024-08-01 ENCOUNTER — Telehealth: Payer: Self-pay | Admitting: Family Medicine

## 2024-08-01 NOTE — Telephone Encounter (Signed)
 09/04/2023 no show 10/26/2023 no show 07/25/2024 no show  Final warning letter sent via mail and mychart.

## 2024-12-03 ENCOUNTER — Emergency Department

## 2024-12-03 ENCOUNTER — Other Ambulatory Visit: Payer: Self-pay

## 2024-12-03 ENCOUNTER — Emergency Department
Admission: EM | Admit: 2024-12-03 | Discharge: 2024-12-03 | Disposition: A | Discharge status: Home / Self Care | Attending: Emergency Medicine | Admitting: Emergency Medicine

## 2024-12-03 ENCOUNTER — Encounter: Payer: Self-pay | Admitting: Emergency Medicine

## 2024-12-03 DIAGNOSIS — M5412 Radiculopathy, cervical region: Secondary | ICD-10-CM | POA: Insufficient documentation

## 2024-12-03 DIAGNOSIS — M546 Pain in thoracic spine: Secondary | ICD-10-CM | POA: Diagnosis present

## 2024-12-03 MED ORDER — METHOCARBAMOL 500 MG PO TABS: 1000.0000 mg | ORAL_TABLET | Freq: Three times a day (TID) | ORAL | 0 refills | Status: AC

## 2024-12-03 MED ORDER — IBUPROFEN 600 MG PO TABS
600.0000 mg | ORAL_TABLET | Freq: Once | ORAL | Status: AC
  Administered 2024-12-03: 600 mg via ORAL
  Filled 2024-12-03: qty 1

## 2024-12-03 MED ORDER — LIDOCAINE 5 % EX PTCH
1.0000 | MEDICATED_PATCH | CUTANEOUS | Status: DC
  Administered 2024-12-03: 1 via TRANSDERMAL
  Filled 2024-12-03: qty 1

## 2024-12-03 MED ORDER — OXYCODONE-ACETAMINOPHEN 5-325 MG PO TABS: 1.0000 | ORAL_TABLET | Freq: Four times a day (QID) | ORAL | 0 refills | Status: AC | PRN

## 2024-12-03 MED ORDER — ACETAMINOPHEN 325 MG PO TABS
650.0000 mg | ORAL_TABLET | Freq: Once | ORAL | Status: AC
  Administered 2024-12-03: 650 mg via ORAL
  Filled 2024-12-03: qty 2

## 2024-12-03 MED ORDER — METHOCARBAMOL 500 MG PO TABS
1000.0000 mg | ORAL_TABLET | Freq: Once | ORAL | Status: AC
  Administered 2024-12-03: 1000 mg via ORAL
  Filled 2024-12-03: qty 2

## 2024-12-03 MED ORDER — LIDOCAINE 5 % EX PTCH: 1.0000 | MEDICATED_PATCH | CUTANEOUS | 0 refills | Status: AC

## 2024-12-03 MED ORDER — OXYCODONE-ACETAMINOPHEN 5-325 MG PO TABS
1.0000 | ORAL_TABLET | Freq: Once | ORAL | Status: AC
  Administered 2024-12-03: 1 via ORAL
  Filled 2024-12-03: qty 1

## 2024-12-03 MED ORDER — MELOXICAM 15 MG PO TABS: 15.0000 mg | ORAL_TABLET | Freq: Every day | ORAL | 0 refills | Status: AC

## 2024-12-03 NOTE — Discharge Instructions (Addendum)
 You can take 650 mg of Tylenol  every 6 hours as needed for pain. You can use ice, heat, muscle creams and other topical pain relievers as well.  Please take the meloxicam  (Mobic ) once a day for 2 weeks.  This is an anti-inflammatory.  Do not take other NSAIDs while taking this medication.  NSAIDs include ibuprofen , Motrin , Advil , naproxen , Aleve , celecoxib, and Celebrex.  I have sent a muscle relaxer to your pharmacy. This can be taken every 8 hours as needed for muscle spasms. This medication is sedating, so do not drive for 8 hours after taking it.   I have sent lidocaine  patches to the pharmacy.  These can be worn for 12 hours at a time.  After 12 hours please remove it for 12 hours before replacing.  I have sent a strong pain medication called Percocet to the pharmacy.  This medication can be taken every 4-6 hours as needed for severe or breakthrough pain.  This medication can cause dependency so only take if you are unable to control your pain with other medications.  It will make you sleepy so do not drive or operate heavy machinery after taking it.  Do not drink alcohol while taking this medication.  This medication can also cause constipation so please take an over-the-counter stool softener like MiraLAX  or Colace while taking it.  This medication contains both oxycodone  and acetaminophen .  Do not take Tylenol  at the same time.  You can take the Percocet or Tylenol  but not both.  If you have back pain, most people start to feel better within a few weeks. Staying active and returning to your normal activities, even if you have pain, helps you recover faster. Avoid staying in bed for long periods, as this can slow your recovery.   Return to the ED or contact your doctor right away if you experience any of the following: New weakness, numbness or tingling in your legs or feet Loss of control of your bladder or bowels (trouble peeing or pooping, or accidents) Severe pain that does not improve or  gets much worse Fever, chills or unexplained weight loss Pain after a fall, injury or trauma Difficulty walking or standing  Try to keep moving and do your usual activities is much as you can.  If your pain does not improve after a few weeks, or if you have questions or concerns, please follow-up with your doctor.  Remember a small increase in pain when you move it is normal and does not mean you are causing harm.  Staying active is important for your recovery.  Follow-up with your primary care provider or the orthopedic provider attached to discuss physical therapy.  I have also listed information below for the orthopedic specific urgent care.  [facility] [address] [place], [place] [address] Open every day 9am-9pm

## 2024-12-03 NOTE — ED Provider Notes (Signed)
 "  [facility] Provider Note    Event Date/Time   First MD Initiated Contact with Patient 12/03/24 [phone]     (approximate)   History   Back Pain   HPI  Joshua [name] is a 24 y.o. male with PMH of TBI and subdural hematoma presents for evaluation of upper back pain.  Patient endorses back pain over the past few days with it getting worse today.  He has had tingling in his hands but no weakness or loss of sensation.  Patient describes the pain in his back as a burning pain.  He denies any specific trauma or injury.  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  No loss of bladder or bowel control, saddle anesthesia or fevers.      Physical Exam   Triage Vital Signs: ED Triage Vitals  Encounter Vitals Group     BP 12/03/24 0439 (!) 142/86     Girls Systolic BP Percentile --      Girls Diastolic BP Percentile --      Boys Systolic BP Percentile --      Boys Diastolic BP Percentile --      Pulse Rate 12/03/24 0439 (!) 102     Resp 12/03/24 0439 18     Temp 12/03/24 0439 98.2 F (36.8 C)     Temp Source 12/03/24 0439 Oral     SpO2 12/03/24 0439 97 %     Weight 12/03/24 0432 275 lb (124.7 kg)     Height 12/03/24 0432 6' (1.829 m)     Head Circumference --      Peak Flow --      Pain Score 12/03/24 0432 8     Pain Loc --      Pain Education --      Exclude from Growth Chart --     Most recent vital signs: Vitals:   12/03/24 0439 12/03/24 0717  BP: (!) 142/86   Pulse: (!) 102   Resp: 18   Temp: 98.2 F (36.8 C)   SpO2: 97% 97%   General: Awake, no distress.  CV:  Good peripheral perfusion.  RRR. Resp:  Normal effort.  CTAB. Abd:  No distention.  Other:  No tenderness to palpation over the thoracic spine but is tender to palpation over the paraspinal muscles in the thoracic spine, does have tenderness to palpation of the cervical spine, radial pulses are 2+ and regular bilaterally, sensation intact in  upper extremities, strength is equal bilaterally.   ED Results / Procedures / Treatments   Labs (all labs ordered are listed, but only abnormal results are displayed) Labs Reviewed - No data to display    RADIOLOGY  CT cervical spine and thoracic spine x-ray obtained, I interpreted the images as well as reviewed the radiologist report.  CT was negative for acute abnormalities and thoracic spine x-ray was also negative for acute abnormalities but did show a wedge deformity at T11.  PROCEDURES:  Critical Care performed: No  Procedures   MEDICATIONS ORDERED IN ED: Medications  ibuprofen  (ADVIL ) tablet 600 mg (600 mg Oral Given 12/03/24 0754)  acetaminophen  (TYLENOL ) tablet 650 mg (650 mg Oral Given 12/03/24 0754)  methocarbamol  (ROBAXIN ) tablet 1,000 mg (1,000 mg Oral Given 12/03/24 9187)  oxyCODONE -acetaminophen  (PERCOCET/ROXICET) 5-325 MG per tablet 1 tablet (1 tablet Oral Given 12/03/24 1047)     IMPRESSION / MDM / ASSESSMENT AND PLAN / ED COURSE  I reviewed the triage vital signs and the nursing notes.                             24 year old male presents for evaluation of back pain.  Vital signs stable aside from elevated blood pressure and heart rate being elevated a little bit which I suspect is due to pain as patient is quite uncomfortable on exam.  Differential diagnosis includes, but is not limited to, cervical radiculopathy, spinal stenosis, arthritis, muscle strain, fracture.  Patient's presentation is most consistent with acute complicated illness / injury requiring diagnostic workup.  Given the reports of tingling in the bilateral hands, will obtain CT cervical spine to evaluate for spinal stenosis that may be causing a nerve compression.  Patient was also tender to palpation over the cervical spine.  Suspect that patient's pain in his thoracic spine is muscular as he is not tender to palpation over the thoracic spine  But will obtain x-ray to rule out fracture.   Patient did not present with red flag signs or symptoms concerning for spinal cord compression so do not feel that MRI is indicated today.  Imaging is negative for acute abnormalities.  Suspect the patient's pain is due to a combination of cervical radiculopathy and a muscle strain.  Patient was given ibuprofen , Tylenol , lidocaine  patch, methocarbamol  and Percocet to address his pain while in the ED.  Will send him home with prescription for meloxicam , Robaxin , lidocaine  patches and Percocet.  Discussed using Percocet only for breakthrough pain.  Advised him to use Tylenol  in addition as needed.  Will give him follow-up information for orthopedics and the orthopedic walk-in clinic.  Discussed return precautions.  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  All questions were answered and patient was stable at discharge.     FINAL CLINICAL IMPRESSION(S) / ED DIAGNOSES   Final diagnoses:  Acute left-sided thoracic back pain  Cervical radiculopathy     Rx / DC Orders   ED Discharge Orders          Ordered    meloxicam  (MOBIC ) 15 MG tablet  Daily        12/03/24 1015    methocarbamol  (ROBAXIN ) 500 MG tablet  3 times daily        12/03/24 1015    lidocaine  (LIDODERM ) 5 %  Every 24 hours        12/03/24 1015    oxyCODONE -acetaminophen  (PERCOCET) 5-325 MG tablet  Every 6 hours PRN        12/03/24 1015             Note:  This document was prepared using Dragon voice recognition software and may include unintentional dictation errors.   Cleaster Tinnie LABOR, PA-C 12/03/24 1555  "

## 2024-12-03 NOTE — ED Triage Notes (Addendum)
 Patient ambulatory to triage with steady gait, without difficulty or distress noted; pt reports x wk having pain to mid/upper back with tingling to hands & back; denies any known injury, denies any accomp symptoms; took [facility] powder PTA without relief
